# Patient Record
Sex: Male | Born: 1960 | Race: White | Hispanic: No | Marital: Married | Smoking: Former smoker
Health system: Southern US, Community
[De-identification: ages and names within clinical notes are randomized; demographics above are authoritative.]

## PROBLEM LIST (undated history)

## (undated) DIAGNOSIS — I10 Essential (primary) hypertension: Secondary | ICD-10-CM

## (undated) DIAGNOSIS — E785 Hyperlipidemia, unspecified: Secondary | ICD-10-CM

## (undated) DIAGNOSIS — N400 Enlarged prostate without lower urinary tract symptoms: Secondary | ICD-10-CM

## (undated) HISTORY — PX: APPENDECTOMY: SHX54

## (undated) HISTORY — PX: VASECTOMY: SHX75

## (undated) HISTORY — DX: Hyperlipidemia, unspecified: E78.5

## (undated) HISTORY — DX: Benign prostatic hyperplasia without lower urinary tract symptoms: N40.0

## (undated) HISTORY — PX: CHOLECYSTECTOMY: SHX55

## (undated) HISTORY — PX: CORONARY ANGIOPLASTY WITH STENT PLACEMENT: SHX49

---

## 1998-01-19 ENCOUNTER — Inpatient Hospital Stay (HOSPITAL_COMMUNITY): Admission: EM | Admit: 1998-01-19 | Discharge: 1998-01-21 | Payer: Self-pay | Admitting: Emergency Medicine

## 2000-02-26 ENCOUNTER — Inpatient Hospital Stay (HOSPITAL_COMMUNITY): Admission: EM | Admit: 2000-02-26 | Discharge: 2000-02-29 | Payer: Self-pay | Admitting: *Deleted

## 2002-08-10 ENCOUNTER — Encounter: Payer: Self-pay | Admitting: Family Medicine

## 2002-08-10 ENCOUNTER — Ambulatory Visit (HOSPITAL_COMMUNITY): Admission: RE | Admit: 2002-08-10 | Discharge: 2002-08-10 | Payer: Self-pay | Admitting: Family Medicine

## 2006-09-21 ENCOUNTER — Ambulatory Visit (HOSPITAL_COMMUNITY): Admission: RE | Admit: 2006-09-21 | Discharge: 2006-09-21 | Payer: Self-pay | Admitting: Family Medicine

## 2007-12-08 ENCOUNTER — Ambulatory Visit (HOSPITAL_COMMUNITY): Admission: RE | Admit: 2007-12-08 | Discharge: 2007-12-08 | Payer: Self-pay | Admitting: Family Medicine

## 2008-09-21 ENCOUNTER — Encounter: Payer: Self-pay | Admitting: Emergency Medicine

## 2008-09-21 ENCOUNTER — Inpatient Hospital Stay (HOSPITAL_COMMUNITY): Admission: EM | Admit: 2008-09-21 | Discharge: 2008-09-23 | Payer: Self-pay | Admitting: Cardiology

## 2008-09-26 ENCOUNTER — Emergency Department (HOSPITAL_COMMUNITY): Admission: EM | Admit: 2008-09-26 | Discharge: 2008-09-26 | Payer: Self-pay | Admitting: Emergency Medicine

## 2008-11-26 ENCOUNTER — Encounter: Admission: RE | Admit: 2008-11-26 | Discharge: 2008-11-26 | Payer: Self-pay | Admitting: Cardiovascular Disease

## 2008-12-04 ENCOUNTER — Ambulatory Visit (HOSPITAL_COMMUNITY): Admission: RE | Admit: 2008-12-04 | Discharge: 2008-12-05 | Payer: Self-pay | Admitting: Cardiovascular Disease

## 2010-10-12 LAB — CBC
HCT: 41.3 % (ref 39.0–52.0)
Hemoglobin: 14.2 g/dL (ref 13.0–17.0)
MCHC: 34.3 g/dL (ref 30.0–36.0)
MCV: 85.4 fL (ref 78.0–100.0)
Platelets: 171 10*3/uL (ref 150–400)
RBC: 4.84 MIL/uL (ref 4.22–5.81)
RDW: 13.4 % (ref 11.5–15.5)
WBC: 6.7 10*3/uL (ref 4.0–10.5)

## 2010-10-12 LAB — BASIC METABOLIC PANEL WITH GFR
BUN: 13 mg/dL (ref 6–23)
CO2: 30 meq/L (ref 19–32)
Calcium: 9.2 mg/dL (ref 8.4–10.5)
Chloride: 106 meq/L (ref 96–112)
Creatinine, Ser: 1.05 mg/dL (ref 0.4–1.5)
GFR calc non Af Amer: >60 mL/min
Glucose, Bld: 112 mg/dL — ABNORMAL HIGH (ref 70–99)
Potassium: 3.9 meq/L (ref 3.5–5.1)
Sodium: 142 meq/L (ref 135–145)

## 2010-10-12 LAB — LIPID PANEL
Cholesterol: 129 mg/dL (ref 0–200)
LDL Cholesterol: 88 mg/dL (ref 0–99)
Triglycerides: 68 mg/dL (ref <150)

## 2010-10-15 LAB — BASIC METABOLIC PANEL
BUN: 15 mg/dL (ref 6–23)
CO2: 27 mEq/L (ref 19–32)
CO2: 22 mEq/L (ref 19–32)
Chloride: 106 mEq/L (ref 96–112)
GFR calc non Af Amer: 53 mL/min — ABNORMAL LOW (ref >60)
Glucose, Bld: 102 mg/dL — ABNORMAL HIGH (ref 70–99)
Glucose, Bld: 110 mg/dL — ABNORMAL HIGH (ref 70–99)
Potassium: 4.2 mEq/L (ref 3.5–5.1)
Potassium: 3.1 mEq/L — ABNORMAL LOW (ref 3.5–5.1)
Sodium: 133 mEq/L — ABNORMAL LOW (ref 135–145)

## 2010-10-15 LAB — CBC
HCT: 42.8 % (ref 39.0–52.0)
HCT: 44.3 % (ref 39.0–52.0)
HCT: 47.4 % (ref 39.0–52.0)
HCT: 48.6 % (ref 39.0–52.0)
Hemoglobin: 15.6 g/dL (ref 13.0–17.0)
Hemoglobin: 16.5 g/dL (ref 13.0–17.0)
MCHC: 34.8 g/dL (ref 30.0–36.0)
MCHC: 34.8 g/dL (ref 30.0–36.0)
MCHC: 33.9 g/dL (ref 30.0–36.0)
MCV: 83.3 fL (ref 78.0–100.0)
MCV: 83.8 fL (ref 78.0–100.0)
MCV: 83.9 fL (ref 78.0–100.0)
MCV: 83.3 fL (ref 78.0–100.0)
Platelets: 162 10*3/uL (ref 150–400)
Platelets: 188 10*3/uL (ref 150–400)
Platelets: 197 10*3/uL (ref 150–400)
Platelets: 198 10*3/uL (ref 150–400)
Platelets: 200 K/uL (ref 150–400)
RBC: 5.83 MIL/uL — ABNORMAL HIGH (ref 4.22–5.81)
RDW: 12.4 % (ref 11.5–15.5)
RDW: 12.6 % (ref 11.5–15.5)
RDW: 12.8 % (ref 11.5–15.5)
WBC: 6.1 10*3/uL (ref 4.0–10.5)
WBC: 8 10*3/uL (ref 4.0–10.5)
WBC: 10.8 K/uL — ABNORMAL HIGH (ref 4.0–10.5)

## 2010-10-15 LAB — HEPATIC FUNCTION PANEL
ALT: 17 U/L (ref 0–53)
AST: 24 U/L (ref 0–37)
Alkaline Phosphatase: 46 U/L (ref 39–117)
Bilirubin, Direct: 0.2 mg/dL (ref 0.0–0.3)
Total Bilirubin: 1.2 mg/dL (ref 0.3–1.2)

## 2010-10-15 LAB — COMPREHENSIVE METABOLIC PANEL
Albumin: 3.4 g/dL — ABNORMAL LOW (ref 3.5–5.2)
Alkaline Phosphatase: 49 U/L (ref 39–117)
BUN: 15 mg/dL (ref 6–23)
BUN: 17 mg/dL (ref 6–23)
Calcium: 9.2 mg/dL (ref 8.4–10.5)
Chloride: 104 mEq/L (ref 96–112)
Chloride: 106 mEq/L (ref 96–112)
Creatinine, Ser: 1.11 mg/dL (ref 0.4–1.5)
Creatinine, Ser: 1.11 mg/dL (ref 0.4–1.5)
GFR calc non Af Amer: >60 mL/min (ref >60)
Glucose, Bld: 106 mg/dL — ABNORMAL HIGH (ref 70–99)
Potassium: 4.2 mEq/L (ref 3.5–5.1)
Total Bilirubin: 1.3 mg/dL — ABNORMAL HIGH (ref 0.3–1.2)
Total Bilirubin: 2.5 mg/dL — ABNORMAL HIGH (ref 0.3–1.2)

## 2010-10-15 LAB — CARDIAC PANEL(CRET KIN+CKTOT+MB+TROPI)
Relative Index: 1.8 (ref 0.0–2.5)
Troponin I: 0.01 ng/mL (ref 0.00–0.06)
Troponin I: <0.01 ng/mL (ref 0.00–0.06)

## 2010-10-15 LAB — PROTIME-INR
INR: 1 (ref 0.00–1.49)
Prothrombin Time: 13.4 s (ref 11.6–15.2)

## 2010-10-15 LAB — POCT CARDIAC MARKERS
CKMB, poc: 1.1 ng/mL (ref 1.0–8.0)
CKMB, poc: <1 ng/mL — ABNORMAL LOW (ref 1.0–8.0)
Myoglobin, poc: 101 ng/mL (ref 12–200)
Myoglobin, poc: 65.5 ng/mL (ref 12–200)
Troponin i, poc: <0.05 ng/mL (ref 0.00–0.09)
Troponin i, poc: <0.05 ng/mL (ref 0.00–0.09)

## 2010-10-15 LAB — HEPARIN LEVEL (UNFRACTIONATED): Heparin Unfractionated: 0.23 IU/mL — ABNORMAL LOW (ref 0.30–0.70)

## 2010-10-15 LAB — BASIC METABOLIC PANEL WITH GFR
BUN: 21 mg/dL (ref 6–23)
Calcium: 9.6 mg/dL (ref 8.4–10.5)
Chloride: 100 meq/L (ref 96–112)
Creatinine, Ser: 1.41 mg/dL (ref 0.4–1.5)
GFR calc Af Amer: >60 mL/min (ref >60)

## 2010-10-15 LAB — DIFFERENTIAL
Basophils Absolute: 0 K/uL (ref 0.0–0.1)
Basophils Relative: 1 % (ref 0–1)
Eosinophils Absolute: 0.2 10*3/uL (ref 0.0–0.7)
Eosinophils Relative: 2 % (ref 0–5)
Lymphocytes Relative: 20 % (ref 12–46)
Lymphocytes Relative: 22 % (ref 12–46)
Lymphs Abs: 1.6 10*3/uL (ref 0.7–4.0)
Lymphs Abs: 2.4 10*3/uL (ref 0.7–4.0)
Monocytes Absolute: 0.6 10*3/uL (ref 0.1–1.0)
Monocytes Relative: 6 % (ref 3–12)
Neutro Abs: 7.6 K/uL (ref 1.7–7.7)
Neutrophils Relative %: 71 % (ref 43–77)
Neutrophils Relative %: 71 % (ref 43–77)

## 2010-10-15 LAB — LIPID PANEL
Cholesterol: 148 mg/dL (ref 0–200)
HDL: 30 mg/dL — ABNORMAL LOW (ref >39)
Total CHOL/HDL Ratio: 4.9 RATIO
VLDL: 10 mg/dL (ref 0–40)

## 2010-10-15 LAB — AMYLASE: Amylase: 110 U/L (ref 27–131)

## 2010-10-15 LAB — HEPATITIS PANEL, ACUTE: Hep B C IgM: NEGATIVE

## 2010-10-15 LAB — LIPASE, BLOOD: Lipase: 33 U/L (ref 11–59)

## 2010-10-15 LAB — POCT I-STAT, CHEM 8
BUN: 15 mg/dL (ref 6–23)
Chloride: 103 mEq/L (ref 96–112)
Creatinine, Ser: 1 mg/dL (ref 0.4–1.5)
Potassium: 4.3 mEq/L (ref 3.5–5.1)
Sodium: 139 mEq/L (ref 135–145)

## 2010-10-15 LAB — MAGNESIUM: Magnesium: 1.9 mg/dL (ref 1.5–2.5)

## 2010-10-15 LAB — HEMOGLOBIN A1C
Hgb A1c MFr Bld: 5.5 % (ref 4.6–6.1)
Mean Plasma Glucose: 111 mg/dL

## 2010-11-17 NOTE — Consult Note (Signed)
[name], [name] NO.:  [id number]   MEDICAL RECORD NO.:  [id number]          PATIENT TYPE:  INP   LOCATION:  2036                         FACILITY:  [facility]   PHYSICIAN:  Graylin Shiver, M.D.   DATE OF BIRTH:  23-Oct-1960   DATE OF CONSULTATION:  09/22/2008  DATE OF DISCHARGE:                                 CONSULTATION   REASON FOR CONSULTATION:  The patient is a 50 year old male who  yesterday was working out in his yard and started to develop chest pain.  He went to an emergency facility where he was evaluated and subsequently  transferred to [facility] under the Cardiology Service.  Cardiology has  evaluated him and does not feel that the current pain is of cardiac  origin and requested a GI evaluation.  The patient points to the area of  pain as actually in the left to mid anterior lower chest and upper  abdomen area.  He tells me that this is where the pain was.  He states  that it radiated through to his back.  He is not having it at the  present time.  When he was admitted to the hospital, his initial hepatic  function panel at 4:25 p.m. showed that all of his liver enzymes were  normal, however, liver enzymes at 10:00 p.m. that evening showed that  his total bilirubin had increased to 2.5, AST 186, and ALT 100.  The  patient has a history of having had a cholecystectomy in 1999.  He does  not recall if he had gallstones or not.   The patient follows in the office periodically with Dr. Ewing Schlein.  He has  had intermittent epigastric discomfort felt secondary to reflux which  has improved periodically when he takes a proton pump inhibitor such as  Nexium or Prilosec.   The patient does have a history of coronary artery disease with a  coronary stent placed in the past.   OTHER MEDICAL PROBLEMS:  1. Hypertension.  2. Dyslipidemia.  3. GERD.   SOCIAL HISTORY:  Drinks alcohol very occasionally.  Used to smoke, but  has not done in 12 years.   ALLERGIES:  None known.   MEDICATIONS PRIOR TO ADMISSION:  Benicar/HCT, recent Levaquin, Flomax,  hyoscyamine, and aspirin.   REVIEW OF SYSTEMS:  The patient did have a little dyspnea with this and  some dizziness, otherwise negative.   PHYSICAL EXAMINATION:  GENERAL:  He is alert and oriented and in no  acute distress.  VITAL SIGNS:  Stable.  NECK:  Supple.  HEART:  Regular rhythm.  No murmurs.  LUNGS:  Clear.  ABDOMEN:  Soft and nontender.  No obvious hepatosplenomegaly.   IMPRESSION:  1. Probable noncardiac chest pain.  2. Upper abdominal pain.  3. History of cholecystectomy.  4. Elevated liver enzymes.  5. Rule out common bile duct stone.  6. Normal amylase and lipase with no clinical evidence of      pancreatitis.   PLAN:  I will obtain an MRCP to see if we might be dealing with a common  bile duct stone.           ______________________________  Graylin Shiver, M.D.     SFG/MEDQ  D:  09/22/2008  T:  09/22/2008  Job:  [id number]   cc:   Petra Kuba, M.D.  Edsel Petrin, D.O.

## 2010-11-17 NOTE — Discharge Summary (Signed)
[name], [name]                 ACCOUNT NO.:  [id number]   MEDICAL RECORD NO.:  [id number]          PATIENT TYPE:  OIB   LOCATION:  4703                         FACILITY:  [facility]   PHYSICIAN:  Richard A. Alanda Amass, M.D.DATE OF BIRTH:  10-14-1960   DATE OF ADMISSION:  12/04/2008  DATE OF DISCHARGE:  12/05/2008                               DISCHARGE SUMMARY   DISCHARGE DIAGNOSES:  1. Abnormal nuclear stress test after episode of chest and abdominal      discomfort.  2. Stable coronary arteries with in-stent restenosis of 40% to the      stent to the left anterior descending which was originally placed      in 1998.  3. Continual episodic bleeding of right groin cath site requiring      overnight observation.  4. Coronary artery disease with a history of stent to the left      anterior descending in 1998.   DISCHARGE CONDITION:  Improved.   PROCEDURES:  Combined left heart cath by Dr. Susa Griffins on December 04, 2008.   The patient presented to Short Stay for scheduled elective cardiac  catheterization which he underwent without complications.  They did an  Angio-Seal device at end of the procedure.  The patient did well, was  instructed on his coronary artery disease, and then was transferred to  Short Stay.  Due to the Angio-Seal, the patient was on bedrest for 2  hours.  When he got up, he started bleeding at site.  Pressure was held  and pressure dressing applied and he was back on bedrest for another 4  hours.  At the end of which time, he stood to walk and started bleeding  again.  At that point, we kept him overnight.  No further bleeding was  noted.  He stayed on bed rest until 10 p.m.  The pressure dressing was  removed at that point and the patient ambulated without any further  bleeding.  He has no complaints the next morning.  Hemoglobin did drop  from 15.2-14.2, hematocrit 41.3, discharge WBC 6.7, platelets 171, MCV  85.4.  Chemistry, sodium 142, potassium 3.9,  chloride 106, CO2 of 30,  BUN 13, creatinine 1.05, and glucose 112.   Cholesterol panel was done while he was fasting prior to the procedure,  total cholesterol 129, LDL 88, HDL 27, and triglycerides 68.  Please  note, the patient would benefit from Niaspan, but he has had problems  with statins in the past and he just now is tolerating his Crestor, so  we will wait until he sees Dr. Alanda Amass on December 19, 2008, for further  decision making on Niaspan to improve his HDL.   PHYSICAL EXAMINATION:  VITAL SIGNS AT DISCHARGE:  Blood pressure 103/67,  pulse 58, respirations 20, temp 97.8, oxygen saturation 96% on room air.  LUNGS:  Clear without rales, rhonchi, or wheezes.  HEART:  Heart tones S1 and S2, regular rate and rhythm, right groin cath  site.  SKIN:  Past hematoma somewhat slightly tense, but no bruit noted.  No  ecchymosis currently.  The patient was instructed ecchymosis most likely  would occur.  He was also instructed if any further bleeding or if the  site would become more swollen or painful to call our office and we  would evaluated it.  He is leaving on vacation on December 16, 2008, and he  was instructed if he had any questions to please call the [place]  office and we would see him prior to his vacation.   DISCHARGE MEDICATIONS:  1. Aspirin 81 mg daily.  2. Hyoscyamine 0.125 mg twice a day.  3. Flomax 0.4 mg every evening.  4. Flonase spray 2 sprays each nostril.  5. Benicar HCT 20/12.5 in the evening.  6. Protonix 40 mg daily.  7. Crestor 5 mg daily.   DISCHARGE INSTRUCTIONS:  1. Return to work as before and until after December 16, 2008, is the plan      and then he will be on vacation.  2. Low-sodium, heart-healthy diet.  3. Wash cath site with soap and water.  Call if any bleeding,      swelling, or drainage.  4. Increase activity slowly.  May shower, but no tub bath for 1 week      and no swimming for 1 week.  No lifting for 2 days.  No driving for      2 days.   5. Follow up with Dr. Alanda Amass in [place] on December 19, 2008, at      10:30 a.m.   The patient will call if he has problems.  Otherwise, he will be  discharged home.      Darcella Gasman. [place], N.P.      Richard A. Alanda Amass, M.D.  Electronically Signed    LRI/MEDQ  D:  12/05/2008  T:  12/05/2008  Job:  [id number]   cc:   Kirk Ruths, M.D.

## 2010-11-17 NOTE — H&P (Signed)
NAMEHAMISH, Ethan [name]                 ACCOUNT NO.:  [id number]   MEDICAL RECORD NO.:  [id number]          PATIENT TYPE:  OIB   LOCATION:  2899                         FACILITY:  [facility]   PHYSICIAN:  Richard A. Alanda Amass, M.D.DATE OF BIRTH:  11-Oct-1960   DATE OF ADMISSION:  12/04/2008  DATE OF DISCHARGE:                              HISTORY & PHYSICAL   CHIEF COMPLAINT:  Currently no complaints.   HISTORY OF PRESENT ILLNESS:  A 50 year old white divorced male who has a  history of coronary artery disease receiving a non-DES stent to the mid  LAD in 1998 by Dr. __________.  His last cardiac catheterization was in  2001 with dominant circ which was normal, small nondominant RCA and no  restenosis of the mid LAD stent.  He did have 30% narrowing of the  diagonal 1 and the ostium.  The large ramus was normal.  The patient had  been doing quite well until March 2010.  He came to [facility] after  transferring from [facility] secondary to upper abdominal and epigastric  discomfort with crampy pain.  Because of his history of coronary  disease, [facility] admitted him to [facility], but after evaluation and  cardiac enzymes which were negative, it was felt this was not cardiac.  EKG was stable, but he did have a presyncopal episode prior to that  admission.  He also had abnormal LFTs with SGOT of 186 and SGPT of 100,  and bilirubin was elevated to 2.5.  He had a CT of his head due to the  presyncope showing no CNS abnormality but inflammatory changes to his  sinus.  He had abdominal ultrasound with no acute findings.  He has a  history of past cholecystectomy and no biliary dilatation.  He did have  an MR study of his abdomen prior to discharge.  No intrahepatic biliary  dilatation with only mild distention of the extrahepatic common duct to  a degree in keeping with spectrum often seen in patient's after  cholecystectomy.  No evidence for ductal stones.  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  He also had a 2.4-  cm simple cyst in the left kidney.  The patient after discharge also saw  ENT doctor in [place] for his sinusitis.  He was found to have  sinus polyps.  His antibiotics were changed, and at this point, it has  completely cleared.  He has no nosebleeds and no sinus infections for  now.  He is using nasal spray, though.   ALLERGIES:  Intolerant to NIASPAN and SIMVASTATIN secondary to myalgias  and groggy feeling, and Niaspan made him feels nervous.   CURRENT MEDICATIONS:  1. Aspirin 81 mg daily.  2. Cialis 20 mg p.r.n.  3. Hyoscyamine 0.125 b.i.d.  4. Flomax 0.4 mg daily.  5. He uses betamethasone cream as needed.  6. Prilosec over-the-counter as needed.  7. Benicar HCT  20/12.5 daily.  8. Meclizine 25 mg p.r.n., but he has not used that in several months.  9. He does use Flonase 2 sprays each nostril daily.  10.Crestor was started at 5 mg daily.   Please note that Crestor was started when follow-up lipids were elevated  with LDL of 108, HDL of 30, and he has had 2 months on Crestor or a good  month and half, and he has not had any complaints.   FAMILY HISTORY:  Not significant to this admission.   SOCIAL HISTORY:  Divorced, 2 children, 2 grandchildren, remote tobacco  but he stopped in 1988.   Other history includes gastroesophageal reflux disease, hypertension,  hyperlipidemia and coronary disease as stated.  He also has erectile  dysfunction.   REVIEW OF SYSTEMS:  GENERAL:  No recent colds or fevers.  He actually  feels better.  He has left fatigue than he did.  He is able to mow the  grass without complications.  SKIN:  No rashes or ulcers.  HEENT: No  blurred vision, double vision.  No sinus infections for now.  HEART:  No  chest pain.  GI:  No complaints of GI discomfort  or bloating.  No  diarrhea, constipation or melena.  GU:  No hematuria or dysuria.  MUSCULOSKELETAL:  Denies any current complaints.  ENDOCRINE:  No  diabetes or thyroid disease and no symptoms of such.  NEUROLOGICAL:  No  lightheadedness, dizziness or syncope except for the one episode of  presyncope back in March that could have been vasovagal with GI issues.  CARDIOVASCULAR:  As stated.  PULMONARY:  As stated.   PHYSICAL EXAMINATION:  Blood pressure 119/69, pulse 96, respiratory 18,  temperature 97.4.  GENERAL:  Alert, oriented white male in no acute distress, awaiting  cardiac catheterization.  Affect pleasant.  HEENT:  Normocephalic.  Sclera clear.  NECK:  Supple.  No JVD, no bruits, no thyromegaly.  LUNGS:  Clear without rales, rhonchi or wheezes.  HEART:  Sounds S1-S2, regular rate and rhythm.  PMI is in the left fifth  intercostal space in the MCL.  No heave currently.  No S3.  No murmur.  ABDOMEN:  Soft, nontender, positive bowel sounds.  Do not palpate liver,  spleen or masses.  LOWER EXTREMITIES:  Without edema.  Pedal pulses 2+ bilaterally.  NEUROLOGICAL:  Alert and oriented x3.  Follows commands.  Moves all  extremities.   EKG on October 21, 2008, was read as within normal limits, nonspecific ST  changes compatible with early repolarization   Nuclear study which was done Nov 05, 2008:  Evidence of mild ischemia in  the basal anterior and mid anterior region, abnormal myocardial  perfusion imaging, EF of 61%.  EKG was negative for ischemia.  Chest x-  ray:  No active disease.  Hemoglobin 15.2, hematocrit 46, platelets 196,  WBC 8.6.  PT 13, INR 1, PTT 27.  Sodium 142, potassium 4.2, BUN 14,  creatinine 1.06 and glucose 75.   IMPRESSION:  1. Abnormal nuclear stress test.  2. History of coronary artery disease with stent to the mid LAD in      1998 by Dr. __________ .  3. Recent upper abdominal pain ruled out for MI but led to a nuclear      stress test which was  positive.  4. Recent sinusitis and sinus polyps without symptoms currently.   PLAN:  Cardiac catheterization as per Dr. Alanda Amass to evaluate cardiac  status with no ischemia.  Otherwise, the patient has no complaints.      Darcella Gasman. [place], N.P.      Richard A. Alanda Amass, M.D.  Electronically Signed    LRI/MEDQ  D:  12/04/2008  T:  12/04/2008  Job:  [id number]   cc:   Gerlene Burdock A. Alanda Amass, M.D.  Kirk Ruths, M.D.  Petra Kuba, M.D.

## 2010-11-17 NOTE — Cardiovascular Report (Signed)
NAMEEARLIE, SCHANK                 ACCOUNT NO.:  [id number]   MEDICAL RECORD NO.:  [id number]          PATIENT TYPE:  OIB   LOCATION:  2899                         FACILITY:  [facility]   PHYSICIAN:  Richard A. Alanda Amass, M.D.DATE OF BIRTH:  1961/03/15   DATE OF PROCEDURE:  12/04/2008  DATE OF DISCHARGE:                            CARDIAC CATHETERIZATION   PROCEDURES:  1. Retrograde central aortic catheterization.  2. Selective coronary angiography via Judkins technique.  3. LV angiogram in RAO and LAO projection.  4. Abdominal angiogram, hand injection.  5. Angio-Seal 6-French femoral closure device deployed successfully.   PROCEDURE IN DETAIL:  The patient was brought to second floor CP lab in  a postabsorptive state after 5 mg Valium p.o. premedication.  Right  groin was prepped, draped in the usual manner.  Xylocaine 1% was used  for local anesthesia, and the patient was given 2 mg of Versed for  sedation.  Informed consent was obtained prior to the procedure.  The  CRFA was entered with single anterior puncture using 18 thin-wall needle  and using modified Seldinger technique, a 6-French short sidearm sheath  was inserted without difficulty.  Diagnostic coronary angiography was  done with 6-French 4-cm tapered preformed coronary and pigtail Cordis  catheter.  Multiple orthogonal projections were obtained at different  magnifications.  LV angiogram was done in the RAO and LAO projection at  25 mL, 14 mL per second and 20 mL, 12 mL per second respectively.  Pullback pressure CA was performed and showed no gradient across the  aortic valve.  Catheter was brought down above the level of renal  arteries and hand injection was performed showing a normal single large  left renal artery and accessory dual normal right renal arteries.  Catheter was removed.  Side-arm sheath was flushed.  Hand injection of  the right common femoral showed good puncture into the right common  femoral on  oblique projection and 6-French Angio-Seal device was  successfully deployed.  The patient tolerated the procedure well, was  transferred to the holding area for postoperative care with anticipation  of discharge later today.   PRESSURES:  LV:  110/0; LVEDP 14 mmHg.   CA:  110/60 mmHg.   There was no gradient across the aortic valve on catheter pullback.   LV angiogram in the RAO and LAO projection showed a normally contracting  left ventricle with no segmental wall motion abnormalities and EF of  greater than 55%.  There was no mitral regurgitation seen.   Live fluoroscopy did not reveal any coronary, intracardiac, or valvular  calcification.  The previously placed proximal-mid LAD stent from 1998  was easily visible (non DES NIR stent).   The main left coronary was normal.   The left anterior descending was a large vessel that coursed to the apex  and undersurface of the heart.  There was a moderate-sized DX1 before  SP1 before the previously placed stent.  It bifurcated and a superior  bifurcation branch had approximately 50% narrowing with good flow.  This  was a thin vessel but moderate length.  At  the junction of the proximal  and mid third of the LAD was previously placed stent from 1998, which  was easily visible fluoroscopically.  There was some very minimal  haziness in the proximal third of the stent with approximately 40%  narrowing with good TIMI III flow.  No evidence of thrombus or  obstruction and good flow of the LAD to the undersurface and apex of the  heart.  There was about 30% narrowing proximal to the stent that was  smooth.  Two septal perforators came off from the midportion of the  stent that were normal.  There was a small bifurcating DX2 that arose  from beyond the stented area.   The remainder of the LAD was smooth with no significant narrowing or  stenosis or flow reduction.   There was an optional diagonal vessel that trifurcated, was moderately   large and had no significant stenosis.   There was a small OM1 and a dominant smooth normal-appearing circumflex  that gave off a PDA and PLA distally and several distal marginal  branches that were normal.   The right coronary was a nondominant vessel giving off the large RV  branch, atrial branch, and was normal throughout its course.   DISCUSSION:  Mr. Rahming is a very pleasant 50 year old divorced white  father of two with two GC who quit smoking an 60.  He is a prior  patient of Dr. Domingo Sep and came under my care in April 2010.  He has a  history of hypertension, hyperlipidemia, and has had statin intolerance  in the past with diffuse myalgias.  He had non DES NIR stenting by Dr.  Meryl Crutch of the proximal-mid LAD in 1998 for symptomatic CAD.  Recatheterization in 2001 showed no restenosis in the mid LAD stent and  otherwise essentially normal coronaries.  There was no ischemia on  followup nuclear test in May 2008.  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  In view of this, a repeat catheterization was  recommended.   The patient has also had a prior history of laparoscopic cholecystectomy  and had a hospitalization in March 2010, with abdominal discomfort,  transient elevated LFTs, and may have passed a common duct stone, was  seen by Dr. Evette Cristal at that time.  He has not had any recurrent GI  symptoms and has not had any angina.   He has also been on Crestor since our last office visit and he has  tolerated this well with no significant STATIN intolerance.  Followup  lipids are pending.   Catheterization reveals some mild haziness in the proximal portion of  the LAD stent, but excellent flow with no flow reduction or thrombus and  normal dominant circumflex and nondominant moderate-sized RCA.  I would  recommend  continued medical therapy including statin therapy, which he  is tolerating well at present.  He will have GI followup with Dr. Ewing Schlein.   CATHETERIZATION DIAGNOSES:  1. Arteriosclerotic heart disease - status post non drug-eluting stent      NIR stent in 1998.  2. A widely patent NIR stent on followup catheterization in 2001, Dr.      Jenne Campus.  3. Patent left anterior descending coronary artery stent on this study      with 40% or less narrowing proximal third.  4. 50%  superior bifurcation branch DX1 narrowing, normal dominant      circumflex, OD and nondominant right coronary artery with normal      left ventricular function.  5. Presumed false negative Cardiolite, May 2010.  6. Hyperlipidemia, statin intolerance stable on Crestor now.  7. Systemic hypertension, normal renal arteries as outlined above.  8. Remote laparoscopic cholecystectomy in 1999.  9. Hospital admission in March 2010 for possible passing common duct      stone.  No recurrence of symptoms.      Richard A. Alanda Amass, M.D.  Electronically Signed     RAW/MEDQ  D:  12/04/2008  T:  12/04/2008  Job:  [id number]   cc:   Kirk Ruths, M.D.  Petra Kuba, M.D.

## 2010-11-17 NOTE — Discharge Summary (Signed)
[name], [name]                 ACCOUNT NO.:  [id number]   MEDICAL RECORD NO.:  [id number]          PATIENT TYPE:  INP   LOCATION:  2036                         FACILITY:  [facility]   PHYSICIAN:  Richard A. Alanda Amass, M.D.DATE OF BIRTH:  January 15, 1961   DATE OF ADMISSION:  09/21/2008  DATE OF DISCHARGE:  09/23/2008                               DISCHARGE SUMMARY   DISCHARGE DIAGNOSES:  1. Epigastric pain, we suspect this might have been from a passed      common bile duct stone, although he had a negative magnetic      resonance cholangiopancreatography this admission.  He did have      transient liver function elevation and nausea and vomiting and      abdominal pain.  2. Known coronary artery disease with previous left anterior      descending stenting in 1998 with catheterization in 2001 showing a      patent stent site.  3. Treated hypertension.  4. Sinusitis, on Levaquin.  5. Gastroesophageal reflux, followed by Dr. Ewing Schlein.   HOSPITAL COURSE:  [name] is a pleasant 51 year old male, followed by  Dr. Phillips Odor, and seen in the past by Dr. Alanda Amass with known coronary  artery disease.  He had an LAD stent in 1998.  He was cath'd in 2001,  and he had a patent stent site and a mild residual first diagonal  narrowing of 30%.  He has treated hypertension.  He has had some  dyslipidemia in the past, but has been intolerant to STATINS because of  myalgias.  The patient was transferred from [facility] where he  initially presented with abdominal pain, nausea, and reportedly some  chest pain, although the patient tells me it was all epigastric  discomfort.  He admitted to nausea after eating.  He was admitted to  telemetry.  He was seen in consult by the GI Service.  His initial LFTs  were elevated with a SGOT of 186 and an SGPT of 100.  His troponins were  negative.  Bilirubin was elevated to 2.5.  The patient was put on clear  liquids.  He was seen in consult by the GI Service.   He did have an MRCP  done on August 25, 2008, which showed no intrahepatic biliary  dilatation with only mild distention of the extrahepatic common duct.  This was in keeping with the spectrum often seen in patients after  cholecystectomy.  Ultrasound of his abdomen revealed no acute findings  and that the patient was status post cholecystectomy.  Symptomatically,  the patient improved over 24 hours with clear liquids.  He did say on  the day of admission, he had a sharp pain that went all the way across  his abdomen.  He has had no further pain and has tolerated the diet  well.  He was put on nitrate on admission and the patient complained of  headache and so this was discontinued.  We feel he can be discharged on  September 23, 2008.  He knows to follow up with Dr. Ewing Schlein.  We will  have him  see Dr. Alanda Amass back in the office in [place].  He can return to  work on Wednesday, September 25, 2008, if he is feeling okay.   DISCHARGE MEDICATIONS:  1. Hyoscyamine 0.125 mg b.i.d.  2. Flomax 0.4 mg at bedtime.  3. Levaquin 500 mg a day.  4. Benicar 20 mg a day.  5. Protonix 40 mg a day.  6. Baby aspirin daily.   LABORATORIES:  White count 6.1, hemoglobin 14.9, hematocrit 42.8, and  platelets 162.  At discharge, his sodium is 141, potassium 4.0, BUN 17,  and creatinine 1.1.  His total bilirubin is down to 1.3, SGOT is 33, and  SGPT is 59.  Amylase and lipase were within normal limits.  CK-MB and  troponins were negative.  Total cholesterol is 148 with an LDL of 108  and HDL of 49.  TSH is 0.56.  Hepatitis B and C surface antigen are  negative.  Chest x-ray shows no acute pulmonary process.  EKG shows  sinus rhythm without acute changes.   DISPOSITION:  The patient is discharged in stable condition and will  follow up with Dr. Ewing Schlein and Dr. Alanda Amass.  At some point, we may want  to consider rechallenging him with a low-dose statin, possibly 2.5 of  Crestor 3 times a week.  We will defer  this to follow up with Dr.  Alanda Amass.       Abelino Derrick, P.A.      Richard A. Alanda Amass, M.D.  Electronically Signed    LKK/MEDQ  D:  09/23/2008  T:  09/24/2008  Job:  [id number]   cc:   Gerlene Burdock A. Alanda Amass, M.D.  Corrie Mckusick, M.D.  Petra Kuba, M.D.

## 2010-11-20 NOTE — Discharge Summary (Signed)
[place]  Patient:    [name], [name]                        MRN: [id number] Adm. Date:  [id number] Disc. Date: [id number] Attending:  Ruta Hinds Dictator:   [facility] CC:         Dr. _________, primary care   Discharge Summary  ADMISSION DIAGNOSES: 1. Unstable angina/chest pain. 2. History of coronary artery disease with percutaneous transluminal coronary    angioplasty to the left anterior descending by Wyvonna Plum.,    M.D. 3. Unknown lipid status. 4. Premature family history of coronary artery disease. 5. Upper quadrant pain.  DISCHARGE DIAGNOSES: 1. Unstable angina/chest pain. 2. History of coronary artery disease with percutaneous transluminal coronary    angioplasty to the left anterior descending by Wyvonna Plum.,    M.D. 3. Unknown lipid status. 4. Premature family history of coronary artery disease. 5. Upper quadrant pain. 6. Status post cardiac catheterization February 29, 2000, by Lenise Herald,    M.D., revealing nonobstructive coronary artery disease.  HISTORY OF PRESENT ILLNESS:  [name] is a 50 year old white divorced male patient with a history of CAD, status post stenting by Dr. Daisy Floro in November of 1998 and recatherization in December of 1998.  He then underwent cholecystectomy January of 1999.  He was transferred to [facility] from [facility] on February 26, 2000, with complaints of chest pain.  He states that he had mostly right upper quadrant discomfort, which was constant during the day and had episodes of tightness in the left side of the chest and neck.  There was no shortness of breath.  Positive diaphoresis, positive weakness, positive presyncope, positive nausea.  He rates it a 7/10 at the worst.  It had waxed and waned and lasted on and off from 11:30 a.m. to 1:30 p.m.  He was given nitroglycerin with some improvement.  He had  abdominal CT which was negative.  Demerol and Phenergan IV eased off the right upper quadrant pain.  On examination here, his blood pressure 136/83.  At that time he was planned to be treated as possible angina.  We will plan to recatheterize him on Monday or Tuesday.  It was felt that he would probably also require a GI consultation as part of the evaluation.  HOSPITAL COURSE:  Over the weekend, over February 27, 2000, and February 28, 2000, [name] remained stable and cardiac enzymes were negative.  He was planned for cardiac catheterization Monday.  On February 29, 2000, [name] underwent cardiac catheterization by Dr. Lenise Herald.  Please see his dictated catheterization report for further detail. Left ventriculogram shows ejection fraction 50%.  He has a codominant system. There is a 30% stenosis off the ostial first diagonal.  Otherwise he has no other significant CAD.  At that time Dr. Jenne Campus planned for bedrest and then discharge home following bedrest if his groin is stable.  He will need an outpatient GI follow-up consult.  On the afternoon of February 29, 2000, [name] is hemodynamically stable with blood pressure in the 120s/60s to 80s.  His right groin is stable at this time with a compression bandage in place and no bleed or hematoma.  If he remains stable at the end of his bedrest and is able to ambulate with no difficulty he will be discharged home at that time.  Post  catheterization instructions have been reviewed. The patient is to follow up with Dr. Alanda Amass in [place], [place]. The office will call him with that appointment.  As well, he will follow up with a GI appointment as an outpatient.  HOSPITAL CONSULTS:  None.  HOSPITAL PROCEDURES:  Cardiac catheterization on February 29, 2000, by Dr. Lenise Herald revealing nonobstructive CAD, normal left ventricular function with EF 50%.  HOSPITAL LABORATORY DATA:  Cardiac enzymes negative with CKs of 74,  61, and 109, MBs 0.6, 0.7, and less than 0.3.  Troponin is 0.04 and 0.03.  Lipid profile showed cholesterol 148, triglyceride 103, HDL 31, LDL 96.  Metabolic profile normal with sodium 140, potassium 4.2, glucose 102, BUN 11, creatinine 0.8.  CBC reveals wbc 7.6, hemoglobin 15.7, hematocrit 46.4, platelets 201.  EKG reveals normal sinus rhythm, 66 beats per minute with no ST or T-wave change.  DISCHARGE MEDICATIONS:  He is to continue his Flagyl as previously instructed, otherwise no medication changes until he follows up with GI.  ACTIVITY:  He is to perform no strenuous activity, lifting greater than five pounds, driving or sexual activity for three days.  DISCHARGE INSTRUCTIONS:  He is to call our office at [phone] if there is any bleeding or increased size or pain of the groin.  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 DD:  02/29/00 TD:  03/01/00 Job: [id number]

## 2010-11-20 NOTE — Cardiovascular Report (Signed)
[place]  Patient:    [name], [name]                        MRN: [id number] Proc. Date: 02/29/00 Adm. Date:  [id number] Attending:  Ruta Hinds CC:         Richard A. Alanda Amass, M.D.                        Cardiac Catheterization  PROCEDURE: 1. Left heart catheterization. 2. Coronary angiography. 3. Left ventriculogram.  COMPLICATIONS:  None.  INDICATIONS:  [name] is a 50 year old white male patient of Dr. Kandy Garrison and Dr. Susa Griffins with a history of PTCA and stenting of his LAD in 1998 by Dr. Daisy Floro.  The patient was admitted on February 26, 2000, with recurrent angina.  He was subsequently ruled out for myocardial infarction.  He is now referred for repeat catheterization.  DESCRIPTION OF PROCEDURE:  After giving informed written consent, the patient was brought to the cardiac catheterization lab where his right and left groins were shaved, prepped and draped in the usual sterile fashion.  ECG monitoring was established.  Using modified Seldinger technique, a #6 French arterial sheath was inserted in the right femoral artery.  A 6 French diagnostic catheter was then used to perform diagnostic angiography.  This revealed a large left main with no significant disease.  The LAD is a large vessel which coursed the apex and gave rise to one diagonal branch.  The LAD was noted to have a stent in its midportion just after the takeoff of the first diagonal with no evidence of instent restenosis.  The remainder of the LAD has no significant disease.  The first diagonal was a large vessel with a 30% ostial lesion.  The left coronary artery also gave rise to a large ramus intermedius which bifurcates distally and has no significant disease.  The left circumflex is a large vessel which is noted to be codominant and gives rise to three obtuse marginal branches as well as a PDA and posterolateral branch.  There is no significant  disease in the AV groove, marginal branches, PDA, or posterolateral branch.  The right coronary artery is a small vessel which is codominant and gives rise to PDA.  There is no significant disease in the RCA or PDA.  HEMODYNAMICS:  Systemic arterial pressure 118/76, LV systemic pressure 118/10, LVDP of 12.  CONCLUSION: 1. No evidence of significant coronary artery disease. 2. Normal left ventricular systolic function. DD:  02/29/00 TD:  02/29/00 Job: [id number]

## 2011-09-17 ENCOUNTER — Other Ambulatory Visit (HOSPITAL_COMMUNITY): Payer: Self-pay | Admitting: Physician Assistant

## 2011-09-17 DIAGNOSIS — K469 Unspecified abdominal hernia without obstruction or gangrene: Secondary | ICD-10-CM

## 2011-09-17 DIAGNOSIS — N419 Inflammatory disease of prostate, unspecified: Secondary | ICD-10-CM

## 2011-09-21 ENCOUNTER — Other Ambulatory Visit (HOSPITAL_COMMUNITY): Payer: Self-pay

## 2011-09-21 ENCOUNTER — Ambulatory Visit (HOSPITAL_COMMUNITY)
Admission: RE | Admit: 2011-09-21 | Discharge: 2011-09-21 | Disposition: A | Discharge status: Home / Self Care | Payer: Federal, State, Local not specified - PPO | Source: Ambulatory Visit | Attending: Physician Assistant | Admitting: Physician Assistant

## 2011-09-21 ENCOUNTER — Encounter (HOSPITAL_COMMUNITY): Payer: Self-pay

## 2011-09-21 DIAGNOSIS — N419 Inflammatory disease of prostate, unspecified: Secondary | ICD-10-CM | POA: Insufficient documentation

## 2011-09-21 DIAGNOSIS — R9389 Abnormal findings on diagnostic imaging of other specified body structures: Secondary | ICD-10-CM | POA: Insufficient documentation

## 2011-09-21 DIAGNOSIS — Q619 Cystic kidney disease, unspecified: Secondary | ICD-10-CM | POA: Insufficient documentation

## 2011-09-21 DIAGNOSIS — K469 Unspecified abdominal hernia without obstruction or gangrene: Secondary | ICD-10-CM

## 2011-09-21 DIAGNOSIS — N400 Enlarged prostate without lower urinary tract symptoms: Secondary | ICD-10-CM | POA: Insufficient documentation

## 2011-09-21 HISTORY — DX: Essential (primary) hypertension: I10

## 2011-09-22 ENCOUNTER — Other Ambulatory Visit (HOSPITAL_COMMUNITY): Payer: Self-pay | Admitting: Physician Assistant

## 2011-09-22 DIAGNOSIS — N289 Disorder of kidney and ureter, unspecified: Secondary | ICD-10-CM

## 2011-09-24 ENCOUNTER — Ambulatory Visit (HOSPITAL_COMMUNITY)
Admission: RE | Admit: 2011-09-24 | Discharge: 2011-09-24 | Disposition: A | Discharge status: Home / Self Care | Payer: Federal, State, Local not specified - PPO | Source: Ambulatory Visit | Attending: Physician Assistant | Admitting: Physician Assistant

## 2011-09-24 ENCOUNTER — Other Ambulatory Visit (HOSPITAL_COMMUNITY): Payer: Self-pay | Admitting: Physician Assistant

## 2011-09-24 DIAGNOSIS — Q619 Cystic kidney disease, unspecified: Secondary | ICD-10-CM | POA: Insufficient documentation

## 2011-09-24 DIAGNOSIS — N289 Disorder of kidney and ureter, unspecified: Secondary | ICD-10-CM

## 2011-09-24 DIAGNOSIS — K7689 Other specified diseases of liver: Secondary | ICD-10-CM | POA: Insufficient documentation

## 2011-09-24 DIAGNOSIS — R109 Unspecified abdominal pain: Secondary | ICD-10-CM | POA: Insufficient documentation

## 2011-09-24 LAB — POCT I-STAT, CHEM 8
BUN: 14 mg/dL (ref 6–23)
Calcium, Ion: 1.07 mmol/L — ABNORMAL LOW (ref 1.12–1.32)
Chloride: 114 mEq/L — ABNORMAL HIGH (ref 96–112)
Glucose, Bld: 122 mg/dL — ABNORMAL HIGH (ref 70–99)

## 2011-09-24 MED ORDER — GADOBENATE DIMEGLUMINE 529 MG/ML IV SOLN
20.0000 mL | Freq: Once | INTRAVENOUS | Status: AC | PRN
  Administered 2011-09-24: 20 mL via INTRAVENOUS

## 2011-10-22 ENCOUNTER — Ambulatory Visit (INDEPENDENT_AMBULATORY_CARE_PROVIDER_SITE_OTHER): Payer: Federal, State, Local not specified - PPO | Admitting: Urology

## 2011-10-22 DIAGNOSIS — N529 Male erectile dysfunction, unspecified: Secondary | ICD-10-CM

## 2011-10-22 DIAGNOSIS — N401 Enlarged prostate with lower urinary tract symptoms: Secondary | ICD-10-CM

## 2011-10-22 DIAGNOSIS — R1032 Left lower quadrant pain: Secondary | ICD-10-CM

## 2011-10-22 DIAGNOSIS — R3129 Other microscopic hematuria: Secondary | ICD-10-CM

## 2011-12-13 ENCOUNTER — Other Ambulatory Visit (HOSPITAL_COMMUNITY): Payer: Self-pay | Admitting: Family Medicine

## 2011-12-13 DIAGNOSIS — M549 Dorsalgia, unspecified: Secondary | ICD-10-CM

## 2011-12-15 ENCOUNTER — Ambulatory Visit (HOSPITAL_COMMUNITY)
Admission: RE | Admit: 2011-12-15 | Discharge: 2011-12-15 | Disposition: A | Discharge status: Home / Self Care | Payer: Federal, State, Local not specified - PPO | Source: Ambulatory Visit | Attending: Family Medicine | Admitting: Family Medicine

## 2011-12-15 DIAGNOSIS — M79609 Pain in unspecified limb: Secondary | ICD-10-CM | POA: Insufficient documentation

## 2011-12-15 DIAGNOSIS — M545 Low back pain, unspecified: Secondary | ICD-10-CM | POA: Insufficient documentation

## 2011-12-15 DIAGNOSIS — M5126 Other intervertebral disc displacement, lumbar region: Secondary | ICD-10-CM | POA: Insufficient documentation

## 2011-12-15 DIAGNOSIS — M549 Dorsalgia, unspecified: Secondary | ICD-10-CM

## 2011-12-24 ENCOUNTER — Ambulatory Visit (INDEPENDENT_AMBULATORY_CARE_PROVIDER_SITE_OTHER): Payer: Federal, State, Local not specified - PPO | Admitting: Urology

## 2011-12-24 DIAGNOSIS — R3129 Other microscopic hematuria: Secondary | ICD-10-CM

## 2011-12-24 DIAGNOSIS — R1032 Left lower quadrant pain: Secondary | ICD-10-CM

## 2012-03-03 ENCOUNTER — Ambulatory Visit (INDEPENDENT_AMBULATORY_CARE_PROVIDER_SITE_OTHER): Payer: Federal, State, Local not specified - PPO | Admitting: Urology

## 2012-03-03 DIAGNOSIS — N529 Male erectile dysfunction, unspecified: Secondary | ICD-10-CM

## 2012-03-03 DIAGNOSIS — N401 Enlarged prostate with lower urinary tract symptoms: Secondary | ICD-10-CM

## 2012-03-03 DIAGNOSIS — R3129 Other microscopic hematuria: Secondary | ICD-10-CM

## 2012-09-08 ENCOUNTER — Other Ambulatory Visit: Payer: Self-pay

## 2012-09-08 ENCOUNTER — Emergency Department (HOSPITAL_COMMUNITY): Payer: Federal, State, Local not specified - PPO

## 2012-09-08 ENCOUNTER — Encounter (HOSPITAL_COMMUNITY): Payer: Self-pay | Admitting: Emergency Medicine

## 2012-09-08 ENCOUNTER — Observation Stay (HOSPITAL_COMMUNITY)
Admission: EM | Admit: 2012-09-08 | Discharge: 2012-09-09 | Disposition: A | Discharge status: Home / Self Care | Payer: Federal, State, Local not specified - PPO | Attending: Internal Medicine | Admitting: Internal Medicine

## 2012-09-08 DIAGNOSIS — R0789 Other chest pain: Secondary | ICD-10-CM

## 2012-09-08 DIAGNOSIS — I2510 Atherosclerotic heart disease of native coronary artery without angina pectoris: Secondary | ICD-10-CM | POA: Insufficient documentation

## 2012-09-08 DIAGNOSIS — R079 Chest pain, unspecified: Principal | ICD-10-CM | POA: Diagnosis present

## 2012-09-08 DIAGNOSIS — I10 Essential (primary) hypertension: Secondary | ICD-10-CM | POA: Diagnosis present

## 2012-09-08 LAB — BASIC METABOLIC PANEL
BUN: 10 mg/dL (ref 6–23)
CO2: 23 mEq/L (ref 19–32)
Calcium: 9.9 mg/dL (ref 8.4–10.5)
Creatinine, Ser: 0.95 mg/dL (ref 0.50–1.35)

## 2012-09-08 LAB — CBC WITH DIFFERENTIAL/PLATELET
Basophils Absolute: 0 10*3/uL (ref 0.0–0.1)
Eosinophils Relative: 3 % (ref 0–5)
HCT: 44.7 % (ref 39.0–52.0)
Hemoglobin: 15.7 g/dL (ref 13.0–17.0)
Lymphocytes Relative: 18 % (ref 12–46)
MCHC: 35.1 g/dL (ref 30.0–36.0)
MCV: 81.6 fL (ref 78.0–100.0)
Monocytes Absolute: 0.5 10*3/uL (ref 0.1–1.0)
Monocytes Relative: 6 % (ref 3–12)
RDW: 12.7 % (ref 11.5–15.5)
WBC: 7.9 10*3/uL (ref 4.0–10.5)

## 2012-09-08 LAB — TROPONIN I: Troponin I: <0.3 ng/mL (ref <0.30)

## 2012-09-08 MED ORDER — FLUTICASONE PROPIONATE 50 MCG/ACT NA SUSP
2.0000 | Freq: Every day | NASAL | Status: DC
  Filled 2012-09-08: qty 16

## 2012-09-08 MED ORDER — ONDANSETRON HCL 4 MG PO TABS: 4.0000 mg | ORAL_TABLET | Freq: Four times a day (QID) | ORAL | Status: DC | PRN

## 2012-09-08 MED ORDER — NITROGLYCERIN 0.4 MG SL SUBL
0.4000 mg | SUBLINGUAL_TABLET | SUBLINGUAL | Status: DC | PRN
  Administered 2012-09-08: 0.4 mg via SUBLINGUAL
  Filled 2012-09-08: qty 25

## 2012-09-08 MED ORDER — PANTOPRAZOLE SODIUM 40 MG IV SOLR
40.0000 mg | Freq: Once | INTRAVENOUS | Status: AC
  Administered 2012-09-08: 40 mg via INTRAVENOUS
  Filled 2012-09-08: qty 40

## 2012-09-08 MED ORDER — MORPHINE SULFATE 2 MG/ML IJ SOLN
2.0000 mg | Freq: Once | INTRAMUSCULAR | Status: AC
  Administered 2012-09-08: 2 mg via INTRAVENOUS
  Filled 2012-09-08: qty 1

## 2012-09-08 MED ORDER — ONDANSETRON HCL 4 MG/2ML IJ SOLN: 4.0000 mg | Freq: Four times a day (QID) | INTRAMUSCULAR | Status: DC | PRN

## 2012-09-08 MED ORDER — TAMSULOSIN HCL 0.4 MG PO CAPS
0.4000 mg | ORAL_CAPSULE | Freq: Every day | ORAL | Status: DC
  Administered 2012-09-08 – 2012-09-09 (×2): 0.4 mg via ORAL
  Filled 2012-09-08 (×2): qty 1

## 2012-09-08 MED ORDER — ONDANSETRON HCL 4 MG/2ML IJ SOLN
4.0000 mg | Freq: Once | INTRAMUSCULAR | Status: AC
  Administered 2012-09-08: 4 mg via INTRAVENOUS
  Filled 2012-09-08: qty 2

## 2012-09-08 MED ORDER — OMEPRAZOLE 20 MG PO CPDR
20.0000 mg | DELAYED_RELEASE_CAPSULE | Freq: Every day | ORAL | Status: DC
  Administered 2012-09-08 – 2012-09-09 (×2): 20 mg via ORAL
  Filled 2012-09-08 (×4): qty 1

## 2012-09-08 MED ORDER — HEPARIN SODIUM (PORCINE) 5000 UNIT/ML IJ SOLN
5000.0000 [IU] | Freq: Three times a day (TID) | INTRAMUSCULAR | Status: DC
  Administered 2012-09-08 – 2012-09-09 (×2): 5000 [IU] via SUBCUTANEOUS
  Filled 2012-09-08 (×2): qty 1

## 2012-09-08 MED ORDER — OMEPRAZOLE MAGNESIUM 20 MG PO TBEC: 20.0000 mg | DELAYED_RELEASE_TABLET | Freq: Every day | ORAL | Status: DC

## 2012-09-08 MED ORDER — IRBESARTAN 150 MG PO TABS
150.0000 mg | ORAL_TABLET | Freq: Every day | ORAL | Status: DC
  Administered 2012-09-08 – 2012-09-09 (×2): 150 mg via ORAL
  Filled 2012-09-08 (×2): qty 1

## 2012-09-08 MED ORDER — MORPHINE SULFATE 2 MG/ML IJ SOLN: 2.0000 mg | INTRAMUSCULAR | Status: DC | PRN

## 2012-09-08 MED ORDER — SODIUM CHLORIDE 0.9 % IJ SOLN
3.0000 mL | Freq: Two times a day (BID) | INTRAMUSCULAR | Status: DC
  Administered 2012-09-08: 3 mL via INTRAVENOUS

## 2012-09-08 MED ORDER — NITROGLYCERIN 2 % TD OINT
1.0000 [in_us] | TOPICAL_OINTMENT | Freq: Once | TRANSDERMAL | Status: AC
  Administered 2012-09-08: 1 [in_us] via TOPICAL
  Filled 2012-09-08: qty 1

## 2012-09-08 MED ORDER — ASPIRIN EC 81 MG PO TBEC
81.0000 mg | DELAYED_RELEASE_TABLET | Freq: Every day | ORAL | Status: DC
  Administered 2012-09-09: 81 mg via ORAL
  Filled 2012-09-08: qty 1

## 2012-09-08 NOTE — ED Notes (Signed)
Pt eating lunch. Wife at bedside. Pt waiting for inpatient bed assignment. States his pain is a 2/10. Left chest area

## 2012-09-08 NOTE — ED Provider Notes (Signed)
Medical screening examination/treatment/procedure(s) were conducted as a shared visit with non-physician practitioner(s) and myself.  I personally evaluated the patient during the encounter.  High risk for coronary artery disease and good history. Admit.  Donnetta Hutching, MD 09/08/12 [phone]

## 2012-09-08 NOTE — ED Notes (Signed)
Pt c/o left sided chest pain and pain under left rib while shoveling snow. Pt states pain was a burning sensation and radiated through body. Pt states he has a hiatal hernia and this pain feels similar.

## 2012-09-08 NOTE — H&P (Signed)
[facility] History and Physical  [name]:[id number] DOB: [birth year] DOA: 09/08/2012  Referring physician: Dr. Adriana Simas, ER physician. PCP: Kirk Ruths, MD    Chief Complaint: Left-sided chest pain.  HPI: [name] is a 52 y.o. male complains of left-sided chest pain which started 8 AM this morning after he had been shoveling some snow. For the last 2 weeks or so years been describing left-sided upper abdominal pain, possibly left-sided lower chest pain which she relates to his hiatal hernia and Prilosec apparently has helped this symptom. This morning, after shoveling snow, he started having left-sided chest pain. This was associated with central burning chest pain radiating to both his arms, this lasted only seconds, definitely less than 1 minute. It was associated with  profuse sweating. He does have a history of coronary artery disease, having had cardiac catheterization and stenting procedure in the 1990s. He is in next smoker, having quit 16 years ago. He does have a strong family history of early coronary artery disease. For these reasons, he is now being admitted for observation.   Review of Systems: Apart from history of present illness, other systems negative.  Past Medical History  Diagnosis Date  . Hypertension    Past Surgical History  Procedure Laterality Date  . Coronary angioplasty with stent placement    . Cholecystectomy    . Appendectomy    . Vasectomy     Social History:  He is single, has a girlfriend. Does not smoke. Virtually no alcohol. He works as a Museum/gallery curator.  Allergies  Allergen Reactions  . Statins Other (See Comments)    Muscles hurt.  . Levofloxacin Palpitations    No family history on file. his father had bypass surgery in his early 69s. His sibling  also have coronary artery disease.  Prior to Admission medications   Medication Sig Start Date End Date Taking? Authorizing Provider  aspirin 81 MG tablet Take 81 mg by mouth  daily.   Yes Historical Provider, MD  fluticasone (FLONASE) 50 MCG/ACT nasal spray Place 2 sprays into the nose daily.   Yes Historical Provider, MD  olmesartan (BENICAR) 20 MG tablet Take 20 mg by mouth daily.   Yes Historical Provider, MD  omeprazole (PRILOSEC OTC) 20 MG tablet Take 20 mg by mouth daily.   Yes Historical Provider, MD  tamsulosin (FLOMAX) 0.4 MG CAPS Take 0.4 mg by mouth daily.   Yes Historical Provider, MD   Physical Exam: Filed Vitals:   09/08/12 0906 09/08/12 1020 09/08/12 1118 09/08/12 1219  BP: 123/79 125/73 105/69 98/58  Pulse: 69 63 68 66  Temp: 98.3 F (36.8 C)     TempSrc: Oral     Resp:  20 20   SpO2: 99% 97% 98% 96%     General:  He looks systemically well. He does not appear to be in any acute pain.  Eyes: No pallor. No jaundice.  ENT: No abnormalities.  Neck: No lymphadenopathy.  Cardiovascular: Heart sounds are present without gallop rhythm. There is no pericardial rub.  Respiratory: Lung fields are clear. There is no pleural rub.  Abdomen: Soft, nontender. No masses.  Skin: No rash.  Musculoskeletal: He is tender in the left lower chest, reproducing the pain.  Psychiatric: Appropriate affect.  Neurologic: Alert and orientated, no focal neurological signs.  Labs on Admission:  Basic Metabolic Panel:  Recent Labs Lab 09/08/12 0928  NA 139  K 3.9  CL 105  CO2 23  GLUCOSE 111*  BUN 10  CREATININE 0.95  CALCIUM 9.9       CBC:  Recent Labs Lab 09/08/12 0928  WBC 7.9  NEUTROABS 5.7  HGB 15.7  HCT 44.7  MCV 81.6  PLT 168   Cardiac Enzymes:  Recent Labs Lab 09/08/12 0928  TROPONINI <0.30      Radiological Exams on Admission: Dg Chest Portable 1 View  09/08/2012  *RADIOLOGY REPORT*  Clinical Data: Chest pain.  PORTABLE CHEST - 1 VIEW  Comparison: Chest x-ray 11/26/2008.  Findings: Lung volumes are normal.  No consolidative airspace disease.  No pleural effusions.  There is an ill-defined nodular opacity  projecting over the lateral aspect of the right upper lobe (immediately superior to the patient's right-sided EKG lead), which is new compared to the prior study from 11/26/2008.  Mild pulmonary venous congestion, without frank pulmonary edema.  Heart size is upper limits of normal.  Upper mediastinal contours are unremarkable.  IMPRESSION: 1.  Mild cardiomegaly with mild pulmonary venous congestion, but no frank pulmonary edema. 2.  New nodular opacity projecting over the lateral aspect of the right upper lobe.  While this could certainly be of infectious or inflammatory etiology, underlying neoplasm is difficult to exclude. Attention on a short term follow-up PA and lateral chest radiograph in 1-2 months is recommended to ensure the stability or resolution of this finding.   Original Report Authenticated By: Trudie Reed, M.D.     EKG: Independently reviewed. Normal sinus rhythm, no acute ST-T wave changes.  Assessment/Plan   1. Left-sided chest pain, possibly musculoskeletal. Several risk factors for coronary heart disease. 2. Hypertension.  3. Previous history of coronary artery disease.  Plan: 1. Admit to telemetry. 2. Serial cardiac enzymes. 3. Hopefully discharge home tomorrow if enzymes are negative. Otherwise he will need further cardiac evaluation.   Code Status: Full code.  Family Communication: Discussed plan with patient at the bedside.   Disposition Plan: Home when medically stable.  Time spent: 45 minutes.  Wilson Singer [facility] Pager [phone].  If 7PM-7AM, please contact night-coverage [email or web address] Password [facility] 09/08/2012, 12:28 PM

## 2012-09-08 NOTE — Progress Notes (Signed)
Nutrition Brief Note  Patient identified on the Malnutrition Screening Tool (MST) Report  There is no height or weight on file to calculate BMI.   Wt Readings from Last 10 Encounters:  No data found for Wt   Current diet order is Heart Healthy, patient is consuming approximately n/a% of meals at this time. Labs and medications reviewed.   Pt reports good appetite and no changes in meal pattern however he has gradually be decreasing his wt (14-23#) without trying. 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  No nutrition interventions warranted at this time. If nutrition issues arise, please consult RD.   Royann Shivers MS,RD,LDN,CSG Office: [phone] Pager: [phone]

## 2012-09-08 NOTE — ED Provider Notes (Signed)
History     CSN: [id number]  Arrival date & time 09/08/12  0905   First MD Initiated Contact with Patient 09/08/12 [phone]      Chief Complaint  Patient presents with  . Chest Pain    (Consider location/radiation/quality/duration/timing/severity/associated sxs/prior treatment) HPI Comments: Patient with hx of cardiac stent placed in the late 90's c/o burning sensation to his left chest, mid back and down both legs that began gradually after shoveling snow this morning.  States that he was driving to work and noticed a "burning sensation all over" and became "weak, shaky and anxious".  States he pulled to side of the road and tried to relax, pain improved but still decided to called 911.  Was given 3 baby aspirin by EMS PTA.  Patient states the pain feels similar to previous pain he has with a hiatal hernia.  Usually take Prilosec but forgot to take it this morning.  He denies shortness of breath,  diaphoresis, vomiting, nausea, pain or numbness of the UE's.   Patient PMD is Dr. Regino Schultze.  Had a cardiac catheterization 2-3 years ago that he states was normal.    Patient is a 52 y.o. male presenting with chest pain. The history is provided by the patient.  Chest Pain Pain location:  L lateral chest Pain quality: burning and pressure   Pain radiates to:  Epigastrium and mid back (and "down both legs") Pain severity:  Mild Onset quality:  Gradual Duration:  1 hour Timing:  Constant Progression:  Improving Chronicity:  Recurrent Context: not breathing, no drug use, not raising an arm and no trauma   Context comment:  Burning sensation to left chest began after shoveling snow. Relieved by:  Certain positions Worsened by:  Certain positions, movement and exertion Ineffective treatments:  Aspirin Associated symptoms: abdominal pain, back pain, numbness and weakness   Associated symptoms: no altered mental status, no cough, no diaphoresis, no dizziness, no dysphagia, no fever, no headache, no  lower extremity edema, no nausea, no orthopnea, no palpitations, no shortness of breath, no syncope and not vomiting   Risk factors: high cholesterol and male sex   Risk factors: no aortic disease, no diabetes mellitus, no hypertension, no immobilization, not obese, no prior DVT/PE and no surgery     Past Medical History  Diagnosis Date  . Hypertension     Past Surgical History  Procedure Laterality Date  . Coronary angioplasty with stent placement    . Cholecystectomy    . Appendectomy    . Vasectomy      No family history on file.  History  Substance Use Topics  . Smoking status: Former Games developer  . Smokeless tobacco: Not on file  . Alcohol Use: Yes     Comment: rare      Review of Systems  Constitutional: Negative for fever, chills, diaphoresis, activity change and appetite change.  HENT: Negative for trouble swallowing, neck pain and neck stiffness.   Eyes: Negative for visual disturbance.  Respiratory: Negative for cough, chest tightness, shortness of breath, wheezing and stridor.   Cardiovascular: Positive for chest pain. Negative for palpitations, orthopnea, leg swelling and syncope.  Gastrointestinal: Positive for abdominal pain. Negative for nausea, vomiting and diarrhea.  Genitourinary: Negative for dysuria, frequency and flank pain.  Musculoskeletal: Positive for back pain.  Skin: Negative for color change and rash.  Neurological: Positive for weakness and numbness. Negative for dizziness, tremors, seizures, syncope, speech difficulty and headaches.  Hematological: Negative for adenopathy.  Psychiatric/Behavioral: Negative  for confusion and altered mental status.  All other systems reviewed and are negative.    Allergies  Statins and Levofloxacin  Home Medications   Current Outpatient Rx  Name  Route  Sig  Dispense  Refill  . aspirin 81 MG tablet   Oral   Take 81 mg by mouth daily.         . fluticasone (FLONASE) 50 MCG/ACT nasal spray   Nasal    Place 2 sprays into the nose daily.         Marland Kitchen olmesartan (BENICAR) 20 MG tablet   Oral   Take 20 mg by mouth daily.         Marland Kitchen omeprazole (PRILOSEC OTC) 20 MG tablet   Oral   Take 20 mg by mouth daily.         . tamsulosin (FLOMAX) 0.4 MG CAPS   Oral   Take 0.4 mg by mouth daily.           BP 123/79  Pulse 69  Temp(Src) 98.3 F (36.8 C) (Oral)  SpO2 99%  Physical Exam  Nursing note and vitals reviewed. Constitutional: He is oriented to person, place, and time. He appears well-developed and well-nourished. No distress.  Pt is well appearing  HENT:  Head: Normocephalic and atraumatic.  Mouth/Throat: Oropharynx is clear and moist.  Eyes: EOM are normal. Pupils are equal, round, and reactive to light.  Neck: Normal range of motion. Neck supple. No JVD present. No thyromegaly present.  Cardiovascular: Normal rate, regular rhythm, normal heart sounds and intact distal pulses.   No murmur heard. Pulmonary/Chest: Effort normal and breath sounds normal. No respiratory distress. He has no decreased breath sounds. He has no wheezes. He exhibits tenderness. He exhibits no mass, no bony tenderness, no laceration, no crepitus, no edema, no deformity, no swelling and no retraction.    Localized ttp of the left antero lateral ribs.  No guarding or crepitus on exam.    Abdominal: Soft. Normal appearance and bowel sounds are normal. He exhibits no distension and no fluid wave. There is no hepatosplenomegaly. There is tenderness in the left upper quadrant. There is no rigidity, no rebound, no guarding, no CVA tenderness and no tenderness at McBurney's point.    ttp of the left upper abdomen and lower portion of the left antero lateral ribs  Musculoskeletal: Normal range of motion.  Neurological: He is alert and oriented to person, place, and time. He exhibits normal muscle tone. Coordination normal.  Skin: Skin is warm and dry.    ED Course  Procedures (including critical care  time)  Labs Reviewed - No data to display No results found. Results for orders placed during the hospital encounter of 09/08/12  CBC WITH DIFFERENTIAL      Result Value Range   WBC 7.9  4.0 - 10.5 K/uL   RBC 5.48  4.22 - 5.81 MIL/uL   Hemoglobin 15.7  13.0 - 17.0 g/dL   HCT 40.9  81.1 - 91.4 %   MCV 81.6  78.0 - 100.0 fL   MCH 28.6  26.0 - 34.0 pg   MCHC 35.1  30.0 - 36.0 g/dL   RDW 78.2  95.6 - 21.3 %   Platelets 168  150 - 400 K/uL   Neutrophils Relative 72  43 - 77 %   Neutro Abs 5.7  1.7 - 7.7 K/uL   Lymphocytes Relative 18  12 - 46 %   Lymphs Abs 1.4  0.7 - 4.0  K/uL   Monocytes Relative 6  3 - 12 %   Monocytes Absolute 0.5  0.1 - 1.0 K/uL   Eosinophils Relative 3  0 - 5 %   Eosinophils Absolute 0.3  0.0 - 0.7 K/uL   Basophils Relative 0  0 - 1 %   Basophils Absolute 0.0  0.0 - 0.1 K/uL  BASIC METABOLIC PANEL      Result Value Range   Sodium 139  135 - 145 mEq/L   Potassium 3.9  3.5 - 5.1 mEq/L   Chloride 105  96 - 112 mEq/L   CO2 23  19 - 32 mEq/L   Glucose, Bld 111 (*) 70 - 99 mg/dL   BUN 10  6 - 23 mg/dL   Creatinine, Ser 9.60  0.50 - 1.35 mg/dL   Calcium 9.9  8.4 - 45.4 mg/dL   GFR calc non Af Amer >90  >90 mL/min   GFR calc Af Amer >90  >90 mL/min  TROPONIN I      Result Value Range   Troponin I <0.30  <0.30 ng/mL    Dg Chest Portable 1 View  09/08/2012  *RADIOLOGY REPORT*  Clinical Data: Chest pain.  PORTABLE CHEST - 1 VIEW  Comparison: Chest x-ray 11/26/2008.  Findings: Lung volumes are normal.  No consolidative airspace disease.  No pleural effusions.  There is an ill-defined nodular opacity projecting over the lateral aspect of the right upper lobe (immediately superior to the patient's right-sided EKG lead), which is new compared to the prior study from 11/26/2008.  Mild pulmonary venous congestion, without frank pulmonary edema.  Heart size is upper limits of normal.  Upper mediastinal contours are unremarkable.  IMPRESSION: 1.  Mild cardiomegaly with mild  pulmonary venous congestion, but no frank pulmonary edema. 2.  New nodular opacity projecting over the lateral aspect of the right upper lobe.  While this could certainly be of infectious or inflammatory etiology, underlying neoplasm is difficult to exclude. Attention on a short term follow-up PA and lateral chest radiograph in 1-2 months is recommended to ensure the stability or resolution of this finding.   Original Report Authenticated By: Trudie Reed, M.D.     MDM    Date: 09/08/2012  Rate: 62  Rhythm: normal sinus rhythm and sinus arrhythmia  QRS Axis: normal  Intervals: normal  ST/T Wave abnormalities: normal  Conduction Disutrbances:none  Narrative Interpretation:   Old EKG Reviewed: no significant changes from 09-26-08    EKG reviewed by Dr. Adriana Simas   Pain improved somewhat after morphine, protonix and one SL NTG but continues to c/o "discomfort" to the left lower chest.  I have discussed pt hx and care plan with the EDP.  Pt also seen by EDP.  Given pt's hx and significant risk factors, I will call hospitalist to admit and order one inch of NTG paste.  Vitals remain stable   11:53 AM consulted Dr. Karilyn Cota , will see pt in ED for chest pain admit    Tammy L. Triplett, PA-C 09/08/12 1229

## 2012-09-09 DIAGNOSIS — R0789 Other chest pain: Secondary | ICD-10-CM

## 2012-09-09 NOTE — Progress Notes (Signed)
Patient given discharge instructions with no questions. Instructed to f/u with primary. Instructed to call 911 for any emergency. Patient left in stable condition with wife. Ambulated out of facility with staff supervision.

## 2012-09-09 NOTE — Discharge Summary (Signed)
Physician Discharge Summary  [name]:[id number] DOB: 1960-08-15 DOA: 09/08/2012  PCP: Kirk Ruths, MD  Admit date: 09/08/2012 Discharge date: 09/09/2012  Time spent: Greater than 30 minutes  Recommendations for Outpatient Follow-up:  1. Followup with cardiology in the next week or 2.   Discharge Diagnoses:  1. Chest pain left-sided, likely musculoskeletal. Serial cardiac enzymes negative. 2. Hypertension, controlled. 3. History of coronary artery disease, currently stable.   Discharge Condition: Stable. Improved.  Diet recommendation: Heart healthy.  Filed Weights   09/08/12 2007  Weight: 108.636 kg (239 lb 8 oz)    History of present illness:  This very pleasant 52 year old man presented to the hospital with symptoms of left-sided chest pain. Please see initial history as outlined below: HPI: [name] is a 52 y.o. male complains of left-sided chest pain which started 8 AM this morning after he had been shoveling some snow. For the last 2 weeks or so years been describing left-sided upper abdominal pain, possibly left-sided lower chest pain which she relates to his hiatal hernia and Prilosec apparently has helped this symptom. This morning, after shoveling snow, he started having left-sided chest pain. This was associated with central burning chest pain radiating to both his arms, this lasted only seconds, definitely less than 1 minute. It was associated with profuse sweating.  He does have a history of coronary artery disease, having had cardiac catheterization and stenting procedure in the 1990s. He is in next smoker, having quit 16 years ago. He does have a strong family history of early coronary artery disease. For these reasons, he is now being admitted for observation.  Hospital Course:  Patient was admitted overnight and his chest pain actually has improved significantly. Of note, he was tender in the left side of the chest, reproducing his pain I really think  this was muscular in nature rather than cardiac. Serial troponin levels are all negative. He does admit that he has not seen his cardiologist for over a year and I recommended that he should do so, he may probably benefit from repeat stress test. He is now stable for discharge  Procedures:  None.   Consultations:  None.  Discharge Exam: Filed Vitals:   09/08/12 1300 09/08/12 1406 09/08/12 2007 09/09/12 0626  BP: 85/55 94/56 96/56  104/62  Pulse: 68 60 53 60  Temp:   97.9 F (36.6 C) 98 F (36.7 C)  TempSrc:   Oral Oral  Resp: 15 18 18 18   Height:   6\' 2"  (1.88 m)   Weight:   108.636 kg (239 lb 8 oz)   SpO2: 97% 98% 96% 97%    General: He looks systemically well. Cardiovascular: Heart sounds are present without gallop rhythm. Respiratory: Lung fields are clear. He is alert and orientated.  Discharge Instructions  Discharge Orders   Future Orders Complete By Expires     Diet - low sodium heart healthy  As directed     Increase activity slowly  As directed         Medication List    TAKE these medications       aspirin 81 MG tablet  Take 81 mg by mouth daily.     fluticasone 50 MCG/ACT nasal spray  Commonly known as:  FLONASE  Place 2 sprays into the nose daily.     olmesartan 20 MG tablet  Commonly known as:  BENICAR  Take 20 mg by mouth daily.     omeprazole 20 MG tablet  Commonly  known as:  PRILOSEC OTC  Take 20 mg by mouth daily.     tamsulosin 0.4 MG Caps  Commonly known as:  FLOMAX  Take 0.4 mg by mouth daily.          The results of significant diagnostics from this hospitalization (including imaging, microbiology, ancillary and laboratory) are listed below for reference.    Significant Diagnostic Studies: Dg Chest Portable 1 View  09/08/2012  *RADIOLOGY REPORT*  Clinical Data: Chest pain.  PORTABLE CHEST - 1 VIEW  Comparison: Chest x-ray 11/26/2008.  Findings: Lung volumes are normal.  No consolidative airspace disease.  No pleural effusions.   There is an ill-defined nodular opacity projecting over the lateral aspect of the right upper lobe (immediately superior to the patient's right-sided EKG lead), which is new compared to the prior study from 11/26/2008.  Mild pulmonary venous congestion, without frank pulmonary edema.  Heart size is upper limits of normal.  Upper mediastinal contours are unremarkable.  IMPRESSION: 1.  Mild cardiomegaly with mild pulmonary venous congestion, but no frank pulmonary edema. 2.  New nodular opacity projecting over the lateral aspect of the right upper lobe.  While this could certainly be of infectious or inflammatory etiology, underlying neoplasm is difficult to exclude. Attention on a short term follow-up PA and lateral chest radiograph in 1-2 months is recommended to ensure the stability or resolution of this finding.   Original Report Authenticated By: Trudie Reed, M.D.         Labs: Basic Metabolic Panel:  Recent Labs Lab 09/08/12 0928  NA 139  K 3.9  CL 105  CO2 23  GLUCOSE 111*  BUN 10  CREATININE 0.95  CALCIUM 9.9       CBC:  Recent Labs Lab 09/08/12 0928  WBC 7.9  NEUTROABS 5.7  HGB 15.7  HCT 44.7  MCV 81.6  PLT 168   Cardiac Enzymes:  Recent Labs Lab 09/08/12 0928 09/08/12 1128 09/08/12 1858 09/08/12 2349  TROPONINI <0.30 <0.30 <0.30 <0.30       Signed:  Lilly Cove C  [facility] 09/09/2012, 9:56 AM

## 2012-09-20 NOTE — Progress Notes (Signed)
UR Chart Review Completed  

## 2012-09-28 ENCOUNTER — Other Ambulatory Visit (HOSPITAL_COMMUNITY): Payer: Self-pay | Admitting: Cardiovascular Disease

## 2012-09-28 DIAGNOSIS — R079 Chest pain, unspecified: Secondary | ICD-10-CM

## 2012-10-12 ENCOUNTER — Ambulatory Visit (HOSPITAL_COMMUNITY)
Admission: RE | Admit: 2012-10-12 | Discharge: 2012-10-12 | Disposition: A | Discharge status: Home / Self Care | Payer: Federal, State, Local not specified - PPO | Source: Ambulatory Visit | Attending: Cardiovascular Disease | Admitting: Cardiovascular Disease

## 2012-10-12 DIAGNOSIS — R0989 Other specified symptoms and signs involving the circulatory and respiratory systems: Secondary | ICD-10-CM | POA: Insufficient documentation

## 2012-10-12 DIAGNOSIS — I10 Essential (primary) hypertension: Secondary | ICD-10-CM | POA: Insufficient documentation

## 2012-10-12 DIAGNOSIS — R42 Dizziness and giddiness: Secondary | ICD-10-CM | POA: Insufficient documentation

## 2012-10-12 DIAGNOSIS — R5381 Other malaise: Secondary | ICD-10-CM | POA: Insufficient documentation

## 2012-10-12 DIAGNOSIS — R0609 Other forms of dyspnea: Secondary | ICD-10-CM | POA: Insufficient documentation

## 2012-10-12 DIAGNOSIS — R079 Chest pain, unspecified: Secondary | ICD-10-CM | POA: Insufficient documentation

## 2012-10-12 DIAGNOSIS — Z8249 Family history of ischemic heart disease and other diseases of the circulatory system: Secondary | ICD-10-CM | POA: Insufficient documentation

## 2012-10-12 DIAGNOSIS — Z87891 Personal history of nicotine dependence: Secondary | ICD-10-CM | POA: Insufficient documentation

## 2012-10-12 MED ORDER — TECHNETIUM TC 99M SESTAMIBI GENERIC - CARDIOLITE
10.6000 | Freq: Once | INTRAVENOUS | Status: AC | PRN
  Administered 2012-10-12: 11 via INTRAVENOUS

## 2012-10-12 MED ORDER — TECHNETIUM TC 99M SESTAMIBI GENERIC - CARDIOLITE
30.4000 | Freq: Once | INTRAVENOUS | Status: AC | PRN
  Administered 2012-10-12: 30 via INTRAVENOUS

## 2012-10-12 NOTE — Procedures (Addendum)
[facility] [address] [place] [address] [phone]  Cardiology Nuclear Med Study  [name] is a 52 y.o. male     MRN : [id number]     DOB: 1960-08-30  Procedure Date: 10/12/2012  Nuclear Med Background Indication for Stress Test:  Stent Patency and Post Hospital History:  CAD;STENT/PTCA--1998 Cardiac Risk Factors: Family History - CAD, History of Smoking, Hypertension, Lipids and Overweight  Symptoms:  Chest Pain, Dizziness, DOE, Fatigue and Light-Headedness   Nuclear Pre-Procedure Caffeine/Decaff Intake:  10:00pm NPO After: 8:00am   IV Site: R Hand  IV 0.9% NS with Angio Cath:  22g  Chest Size (in):  50 IV Started by: Emmit Pomfret, RN  Height: 6\' 2"  (1.88 m)  Cup Size: n/a  BMI:  Body mass index is 30.42 kg/(m^2). Weight:  237 lb (107.502 kg)   Tech Comments:  N/A    Nuclear Med Study 1 or 2 day study: 1 day  Stress Test Type:  Stress  Order Authorizing Provider:  Susa Griffins, MD    Resting Radionuclide: Technetium 26m Sestamibi  Resting Radionuclide Dose: 10.6 mCi   Stress Radionuclide:  Technetium 18m Sestamibi  Stress Radionuclide Dose: 30.4 mCi           Stress Protocol Rest HR: 74 Stress HR:  151  Rest BP:  107/81 Stress BP:  154/69  Exercise Time (min): 9:00 METS: 10.4   Predicted Max HR: 166 bpm % Max HR: 90.96 bpm Rate Pressure Product: 21308  Dose of Adenosine (mg):  n/a Dose of Lexiscan: n/a mg  Dose of Atropine (mg): n/a Dose of Dobutamine: n/a mcg/kg/min (at max HR)  Stress Test Technologist: [name], CCT Nuclear Technologist: [name], CNMT   Rest Procedure:  Myocardial perfusion imaging was performed at rest 45 minutes following the intravenous administration of Technetium 67m Sestamibi. Stress Procedure:  The patient performed treadmill exercise using a Bruce  Protocol for 9:00 minutes. The patient stopped due to SOB and Fatigue and denied any chest pain.   There were no significant ST-T wave changes.  Technetium 68m Sestamibi was injected at peak exercise and myocardial perfusion imaging was performed after a brief delay.  Transient Ischemic Dilatation (Normal <1.22):  0.93 Lung/Heart Ratio (Normal <0.45):  0.34 QGS EDV:  101 ml QGS ESV:  44 ml LV Ejection Fraction: 56%  Signed by [name], CNMT  PHYSICIAN INTERPRETATION  Rest ECG: NSR - Normal EKG  Stress ECG: Insignificant upsloping ST segment depression. Inferior and lateral leads  QPS Raw Data Images:  There is interference from nuclear activity from structures below the diaphragm. This does affect the ability to read the study -- rest images have a large loop of bowel adjacent to the inferolateral wall leading to increased perfusion intensity in this region as compared to rest images.  Normal left ventricular size. Stress Images:  There is decreased uptake in the inferior and inferolateral wall -- a small sized, mild intensity defect, most noted in the inferoapical region; There is also decreased uptake in the anterior wall --  a moderate sized, very mild perfusion defect . Rest Images:  There is decreased uptake in the anterior wall; There is decreased uptake in the inferior wall. Subtraction (SDS):  There does apear to be mild reversibility in the a small inferoapical segment.  Cannot exclude artifact from bowel artifact in rest and not stress images. ; The anterior defect is essentially fixed and mild -- suggestive of  possible soft tissue attenuation or epicardial scar.  Impression Exercise Capacity:  Good exercise capacity. BP Response:  Normal blood pressure response. Clinical Symptoms:  There is dyspnea. ECG Impression:  Insignificant upsloping ST segment depression. LV Wall Motion:  NL LV Function; NL Wall Motion with mildly reduced thickening in the inferoapex.  Comparison with Prior Nuclear Study: No significant change from previous study -- the observed inferio defect  was attributed to diaphragmatic attenuation, and the (now less prominent) anterior defect was considered to be consistent with possible ischemia that was found to be a false positive on cardiac catheterization.  Overall Impression: Abnormal nuclear perfusion imaging with little evidence of reversible perfusion defects consistent with significant ischemia. Low risk stress nuclear study. Subdiaphragmatic tissue attenuation limits the interpretation of rest vs. Stress images. Clinical correlation is warranted.     Marykay Lex, MD  10/13/2012 5:00 PM

## 2013-02-23 ENCOUNTER — Ambulatory Visit (INDEPENDENT_AMBULATORY_CARE_PROVIDER_SITE_OTHER): Payer: Federal, State, Local not specified - PPO | Admitting: Urology

## 2013-02-23 DIAGNOSIS — N401 Enlarged prostate with lower urinary tract symptoms: Secondary | ICD-10-CM

## 2013-02-23 DIAGNOSIS — R972 Elevated prostate specific antigen [PSA]: Secondary | ICD-10-CM

## 2013-02-23 DIAGNOSIS — N529 Male erectile dysfunction, unspecified: Secondary | ICD-10-CM

## 2013-03-07 ENCOUNTER — Encounter (INDEPENDENT_AMBULATORY_CARE_PROVIDER_SITE_OTHER): Payer: Self-pay | Admitting: *Deleted

## 2013-03-13 ENCOUNTER — Encounter: Payer: Self-pay | Admitting: Cardiovascular Disease

## 2013-03-27 ENCOUNTER — Other Ambulatory Visit (INDEPENDENT_AMBULATORY_CARE_PROVIDER_SITE_OTHER): Payer: Self-pay | Admitting: *Deleted

## 2013-03-27 ENCOUNTER — Encounter (INDEPENDENT_AMBULATORY_CARE_PROVIDER_SITE_OTHER): Payer: Self-pay | Admitting: Internal Medicine

## 2013-03-27 ENCOUNTER — Ambulatory Visit (INDEPENDENT_AMBULATORY_CARE_PROVIDER_SITE_OTHER): Payer: Federal, State, Local not specified - PPO | Admitting: Internal Medicine

## 2013-03-27 ENCOUNTER — Encounter (INDEPENDENT_AMBULATORY_CARE_PROVIDER_SITE_OTHER): Payer: Self-pay | Admitting: *Deleted

## 2013-03-27 VITALS — BP 126/74 | HR 64 | Temp 97.0°F | Ht 74.0 in | Wt 248.5 lb

## 2013-03-27 DIAGNOSIS — R1012 Left upper quadrant pain: Secondary | ICD-10-CM

## 2013-03-27 DIAGNOSIS — K219 Gastro-esophageal reflux disease without esophagitis: Secondary | ICD-10-CM

## 2013-03-27 NOTE — Progress Notes (Signed)
Subjective:     Patient ID: [name], male   DOB: 1961-04-25, 52 y.o.   MRN: [id number]  HPI Referred to our office for GERD.(Second opinion).  HHe tells me has acid reflux. He recently placed on Nexium which has helped his acid reflux. He has to sleep on pillows so he want have acid reflux. He has acid reflux once a week or every two weeks. If he eats plain, bland food,  GERD is controlled. He does have frequent burping.  His appetite is good. No weight loss. He denies melena or bright red rectal bleeding.  He c/o left upper quadrant pain. He says he can palpate the area and no pain but he cannot lay on his left side. Symptoms for at least 10 yrs.  (Per Dr. Ewing Schlein notes symptoms for a year).  He saw Dr. Ewing Schlein a couple of months ago for this same problem.  He was placed on Nexium which helped some. He then saw Dr. Regino Schultze. He had pain left upper quadrant with burping. He was placed on Carafate which helped. He has been on the Carafate since April.  Presently taking Protonix for his GERD which was prescribed by Dr. Alanda Amass.   He had an EGD and colonoscopy by Dr. Darlen Round in 2010 and both were normal.  Biopsy:  COLON, ASCENDING, POLYP: - HYPERPLASTIC POLYP. - THERE IS NO EVIDENCE OF MALIGNANCY.    09/24/2011 MR abdomen: for abdominal pain:   IMPRESSION:  1. 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 This is a Bosniak II lesion which  requires no follow-up.  2. Additional benign simple cyst within the left kidney.  3. Benign hepatic lesions.  09/22/2008 MRCP: abdominal pain and elevated LFTs.  extrahepatic common duct to a degree in keeping with the spectrum  often seen in patients after cholecystectomy. No evidence for  ductal stone.  Several tiny hepatic cysts with a 1.4 cm lesion in the upper  posterior right liver which is probably a cavernous hemangioma.  While this cannot be definitively characterized on today's exam, in  a  patient with no cancer history, a lesion of this size has a very  high probability of being benign. 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  2.4 cm simple cyst in the left kidney.  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 He is married. He has 2 children in good health.  Review of Systems see hpi Current Outpatient Prescriptions  Medication Sig Dispense Refill  . aspirin 81 MG tablet Take 81 mg by mouth daily.      Marland Kitchen augmented betamethasone dipropionate (DIPROLENE-AF) 0.05 % cream Apply topically 2 (two) times daily.      . fluticasone (FLONASE) 50 MCG/ACT nasal spray Place 2 sprays into the nose daily.      Marland Kitchen olmesartan (BENICAR) 20 MG tablet Take 20 mg by mouth daily.      . pantoprazole (PROTONIX) 40 MG tablet Take 40 mg by mouth daily.      . sucralfate (CARAFATE) 1 G tablet Take 1 g by mouth 4 (four) times daily.      . tamsulosin (FLOMAX) 0.4 MG CAPS Take 0.4 mg by mouth daily.       No current facility-administered medications for this visit.   Past Medical History  Diagnosis Date  . Hypertension   . BPH (benign prostatic hyperplasia)    Allergies  Allergen Reactions  . Statins Other (See Comments)    Muscles hurt.  Marland Kitchen  Levofloxacin Palpitations   Past Surgical History  Procedure Laterality Date  . Coronary angioplasty with stent placement      November 1998  . Cholecystectomy    . Appendectomy    . Vasectomy         Objective:   Physical Exam  Filed Vitals:   03/27/13 1517  BP: 126/74  Pulse: 64  Temp: 97 F (36.1 C)  Height: 6\' 2"  (1.88 m)  Weight: 248 lb 8 oz (112.719 kg)   Alert and oriented. Skin warm and dry. Oral mucosa is moist.   . Sclera anicteric, conjunctivae is pink. Thyroid not enlarged. No cervical lymphadenopathy. Lungs clear. Heart regular rate and rhythm.  Abdomen is soft. Bowel sounds are positive. No hepatomegaly. No abdominal masses felt. No tenderness.  No edema to lower extremities.       Assessment:    GERD. He continues to have some break  thru. Maintained on Protonix and Carafate at this time. PUD needs to be ruled out.     Plan:      EGD with Dr. Karilyn Cota. If normal, may need CT abdomen for his left upper quadrant pain.

## 2013-03-27 NOTE — Patient Instructions (Addendum)
EGD with Dr. Rehman. 

## 2013-03-30 ENCOUNTER — Encounter (HOSPITAL_COMMUNITY): Payer: Self-pay | Admitting: Pharmacy Technician

## 2013-04-12 ENCOUNTER — Ambulatory Visit (HOSPITAL_COMMUNITY)
Admission: RE | Admit: 2013-04-12 | Discharge: 2013-04-12 | Disposition: A | Discharge status: Home / Self Care | Payer: Federal, State, Local not specified - PPO | Source: Ambulatory Visit | Attending: Internal Medicine | Admitting: Internal Medicine

## 2013-04-12 ENCOUNTER — Encounter (HOSPITAL_COMMUNITY): Payer: Self-pay | Admitting: *Deleted

## 2013-04-12 ENCOUNTER — Encounter (HOSPITAL_COMMUNITY)
Admission: RE | Disposition: A | Discharge status: Home / Self Care | Payer: Self-pay | Source: Ambulatory Visit | Attending: Internal Medicine

## 2013-04-12 DIAGNOSIS — R1012 Left upper quadrant pain: Secondary | ICD-10-CM

## 2013-04-12 DIAGNOSIS — K2289 Other specified disease of esophagus: Secondary | ICD-10-CM

## 2013-04-12 DIAGNOSIS — I10 Essential (primary) hypertension: Secondary | ICD-10-CM | POA: Insufficient documentation

## 2013-04-12 DIAGNOSIS — D131 Benign neoplasm of stomach: Secondary | ICD-10-CM

## 2013-04-12 DIAGNOSIS — K228 Other specified diseases of esophagus: Secondary | ICD-10-CM

## 2013-04-12 DIAGNOSIS — K219 Gastro-esophageal reflux disease without esophagitis: Secondary | ICD-10-CM

## 2013-04-12 HISTORY — PX: ESOPHAGOGASTRODUODENOSCOPY: SHX5428

## 2013-04-12 SURGERY — EGD (ESOPHAGOGASTRODUODENOSCOPY)
Anesthesia: Moderate Sedation

## 2013-04-12 MED ORDER — STERILE WATER FOR IRRIGATION IR SOLN
Status: DC | PRN
  Administered 2013-04-12: 14:00:00

## 2013-04-12 MED ORDER — MIDAZOLAM HCL 5 MG/5ML IJ SOLN
INTRAMUSCULAR | Status: AC
  Filled 2013-04-12: qty 10

## 2013-04-12 MED ORDER — MIDAZOLAM HCL 5 MG/5ML IJ SOLN
INTRAMUSCULAR | Status: DC | PRN
  Administered 2013-04-12: 1 mg via INTRAVENOUS
  Administered 2013-04-12 (×2): 2 mg via INTRAVENOUS

## 2013-04-12 MED ORDER — SODIUM CHLORIDE 0.9 % IV SOLN
INTRAVENOUS | Status: DC
  Administered 2013-04-12: 1000 mL via INTRAVENOUS

## 2013-04-12 MED ORDER — BUTAMBEN-TETRACAINE-BENZOCAINE 2-2-14 % EX AERO
INHALATION_SPRAY | CUTANEOUS | Status: DC | PRN
  Administered 2013-04-12: 2 via TOPICAL

## 2013-04-12 MED ORDER — MEPERIDINE HCL 50 MG/ML IJ SOLN
INTRAMUSCULAR | Status: AC
  Filled 2013-04-12: qty 1

## 2013-04-12 MED ORDER — MEPERIDINE HCL 50 MG/ML IJ SOLN
INTRAMUSCULAR | Status: DC | PRN
Start: 2013-04-12 — End: 2013-04-12
  Administered 2013-04-12 (×2): 25 mg via INTRAVENOUS

## 2013-04-12 NOTE — Progress Notes (Signed)
h pylori sent to lab

## 2013-04-12 NOTE — H&P (Signed)
[name] is an 52 y.o. male.  Chief Complaint: Patient is here for diagnostic EGD. HPI: Patient is 52 year old Caucasian male with chronic GERD and presents with intermittent heartburn and left upper quadrant abdominal pain. Heartburn has responded to therapy much better than this pain has. He has had this pain off and on for several months. Meals seem to decrease severity of his pain. He denies dysphagia nausea vomiting melena or rectal bleeding. He is feeling better since he's been on Carafate but not 100%.  Past Medical History  Diagnosis Date  . Hypertension   . BPH (benign prostatic hyperplasia)     Past Surgical History  Procedure Laterality Date  . Coronary angioplasty with stent placement      November 1998  . Cholecystectomy    . Appendectomy    . Vasectomy      History reviewed. No pertinent family history. Social History:  reports that he has quit smoking. He does not have any smokeless tobacco history on file. He reports that he drinks about 1.2 ounces of alcohol per week. He reports that he does not use illicit drugs.  Allergies:  Allergies  Allergen Reactions  . Statins Other (See Comments)    Muscles hurt.  . Levofloxacin Palpitations    Medications Prior to Admission  Medication Sig Dispense Refill  . aspirin 81 MG tablet Take 81 mg by mouth daily.      Marland Kitchen augmented betamethasone dipropionate (DIPROLENE-AF) 0.05 % cream Apply topically 2 (two) times daily.      . fluticasone (FLONASE) 50 MCG/ACT nasal spray Place 2 sprays into the nose daily.      Marland Kitchen olmesartan (BENICAR) 20 MG tablet Take 20 mg by mouth daily.      . pantoprazole (PROTONIX) 40 MG tablet Take 40 mg by mouth daily.      . sucralfate (CARAFATE) 1 G tablet Take 1 g by mouth 4 (four) times daily.      . tamsulosin (FLOMAX) 0.4 MG CAPS Take 0.4 mg by mouth daily.        No results found for this or any previous visit (from the past 48 hour(s)). No results found.  ROS  Blood pressure 124/82,  pulse 62, temperature 97.9 F (36.6 C), temperature source Oral, resp. rate 14, height 6\' 2"  (1.88 m), weight 248 lb (112.492 kg), SpO2 96.00%. Physical Exam  Constitutional: He appears well-developed and well-nourished.  HENT:  Mouth/Throat: Oropharynx is clear and moist.  Eyes: Conjunctivae are normal. No scleral icterus.  Neck: No thyromegaly present.  Cardiovascular: Normal rate, regular rhythm and normal heart sounds.   No murmur heard. Respiratory: Effort normal and breath sounds normal.  GI: Soft. He exhibits no distension and no mass. There is no tenderness.  Musculoskeletal: He exhibits no edema.  Lymphadenopathy:    He has no cervical adenopathy.  Neurological: He is alert.  Skin: Skin is warm.     Assessment/Plan Chronic GERD. Epigastric and left upper quadrant abdominal pain. Diagnostic EGD.  REHMAN,NAJEEB U 04/12/2013, 2:17 PM

## 2013-04-12 NOTE — Op Note (Signed)
EGD PROCEDURE REPORT  PATIENT:  REAL CONA  MR#:  [id number] Birthdate:  May 01, 1961, 52 y.o., male Endoscopist:  Dr. Malissa Hippo, MD Referred By:  Dr. Kirk Ruths, MD Procedure Date: 04/12/2013  Procedure:   EGD  Indications:  Patient is 52 year old Caucasian male who presents with heartburn and epigastric and left upper quadrant abdominal pain. He he was on PPI for a few years and stopping the medication his symptoms of relapse. Symptom control is not satisfactory to this point. Also complains of intermittent epigastric and left upper quadrant pain which seems to get better with meals. He is undergoing diagnostic EGD.            Informed Consent:  The risks, benefits, alternatives & imponderables which include, but are not limited to, bleeding, infection, perforation, drug reaction and potential missed lesion have been reviewed.  The potential for biopsy, lesion removal, esophageal dilation, etc. have also been discussed.  Questions have been answered.  All parties agreeable.  Please see history & physical in medical record for more information.  Medications:  Demerol 50 mg IV Versed 5 mg IV Cetacaine spray topically for oropharyngeal anesthesia  Description of procedure:  The endoscope was introduced through the mouth and advanced to the second portion of the duodenum without difficulty or limitations. The mucosal surfaces were surveyed very carefully during advancement of the scope and upon withdrawal.  Findings:  Esophagus:  Mucosa of the esophagus was normal except 2 small focal areas of erythema proximal to GE junction suspicious for healing erosions. GE junction was unremarkable. GEJ:  45 cm Stomach:  Stomach was empty and distended very well with insufflation. Folds in the proximal stomach were normal. Examination of mucosa revealed  few 3-4 mm hyperplastic appearing polyps at  gastric body and fundus. Antral mucosa was normal. Focal edema and erythema noted the pyloric  channel which was patent. Angularis was unremarkable. Duodenum:  Normal bulbar and post bulbar mucosa.  Therapeutic/Diagnostic Maneuvers Performed:  None  Complications:  None  Impression: Focal erythema distal esophagus suggestive of healing erosions. Few small hyperplastic polyps in proximal stomach. These were left alone. Pyloric channel inflammation without ulcer or stenosis.  Source of patient's epigastric and LUQ abdominal pain remains unclear.  Recommendations:  H. pylori serology. Patient will continue pantoprazole and sucralfate for now. Symptom diary until office visit in 3-4 weeks.  REHMAN,NAJEEB U  04/12/2013  2:41 PM  CC: Dr. Kirk Ruths, MD & Dr. Bonnetta Barry ref. provider found

## 2013-04-16 ENCOUNTER — Encounter (HOSPITAL_COMMUNITY): Payer: Self-pay | Admitting: Internal Medicine

## 2013-05-09 NOTE — Progress Notes (Signed)
Apt has been scheduled for 05/15/13 with Dr. Karilyn Cota.

## 2013-05-15 ENCOUNTER — Ambulatory Visit (INDEPENDENT_AMBULATORY_CARE_PROVIDER_SITE_OTHER): Payer: Federal, State, Local not specified - PPO | Admitting: Internal Medicine

## 2013-05-15 ENCOUNTER — Encounter (INDEPENDENT_AMBULATORY_CARE_PROVIDER_SITE_OTHER): Payer: Self-pay | Admitting: Internal Medicine

## 2013-05-15 VITALS — BP 118/80 | HR 74 | Temp 97.5°F | Resp 18 | Ht 74.0 in | Wt 244.6 lb

## 2013-05-15 DIAGNOSIS — K589 Irritable bowel syndrome without diarrhea: Secondary | ICD-10-CM

## 2013-05-15 DIAGNOSIS — R1013 Epigastric pain: Secondary | ICD-10-CM

## 2013-05-15 DIAGNOSIS — K219 Gastro-esophageal reflux disease without esophagitis: Secondary | ICD-10-CM

## 2013-05-15 MED ORDER — LANSOPRAZOLE 30 MG PO CPDR: 30.0000 mg | DELAYED_RELEASE_CAPSULE | Freq: Every day | ORAL | Status: DC

## 2013-05-15 NOTE — Patient Instructions (Addendum)
Call if lansoprazole does not work he experienced side effects. High-fiber diet.

## 2013-05-15 NOTE — Progress Notes (Signed)
Presenting complaint;  Followup for GERD and epigastric pain.  Subjective:  Patient is 52 year old Caucasian male who presents for scheduled visit. He underwent EGD on 04/12/2013 for heartburn epigastric and left upper quadrant pain and suboptimal response to therapy. EGD revealed focal. Humalog and distal esophagus suggestive of healing erosions and few hyperplastic polyps. He also had pyloric channel inflammation without ulcer or stenosis. H. pylori serology was negative. He was asked to continue pantoprazole and Carafate. He states he feels lot better. He is at 2 episodes of heartburn since his EGD one month ago. One episode occurred after he ate pizza. Both 3 weeks ago he woke up with nausea and experienced heartburn and in retrospect believes he may have had 24-hour bug. He feels pantoprazole and is helping his heartburn but every time he takes his medication he has upper abdominal pain which occurs within an hour and may last for 2-3 hours. He is willing to try another PPI. Nexium and Prilosec in the past have not worked. He states since he has been on Carafate he is having less diarrhea.  Current Medications: Current Outpatient Prescriptions  Medication Sig Dispense Refill  . aspirin 81 MG tablet Take 81 mg by mouth daily.      Marland Kitchen augmented betamethasone dipropionate (DIPROLENE-AF) 0.05 % cream Apply topically 2 (two) times daily.      . fluticasone (FLONASE) 50 MCG/ACT nasal spray Place 2 sprays into the nose daily.      Marland Kitchen ibuprofen (ADVIL,MOTRIN) 200 MG tablet Take 200 mg by mouth as needed for headache.      . olmesartan (BENICAR) 20 MG tablet Take 20 mg by mouth daily.      . pantoprazole (PROTONIX) 40 MG tablet Take 40 mg by mouth daily.      . sucralfate (CARAFATE) 1 G tablet Take 1 g by mouth 4 (four) times daily.      . tamsulosin (FLOMAX) 0.4 MG CAPS Take 0.4 mg by mouth daily.       No current facility-administered medications for this visit.     Objective: Blood pressure  118/80, pulse 74, temperature 97.5 F (36.4 C), temperature source Oral, resp. rate 18, height 6\' 2"  (1.88 m), weight 244 lb 9.6 oz (110.95 kg). Patient is alert and in no acute distress. Conjunctiva is pink. Sclera is nonicteric Oropharyngeal mucosa is normal. No neck masses or thyromegaly noted. Abdomen is full but soft and nontender without organomegaly or masses. No LE edema or clubbing noted.  Labs/studies Results: H. pylori serology was negative.  Assessment:  #1. GERD. Symptoms well controlled with pantoprazole and Carafate but he appears to be having epigastric pain with pantoprazole. #2. Dyspepsia much improved with therapy. #3. Intermittent diarrhea most likely secondary to IBS. Frequency of bowel movements has decreased since he has been on sucralfate.     Plan: High-fiber diet Discontinue pantoprazole. Begin lansoprazole 30 mg by mouth every morning. Try Carafate only at bedtime or use on as-needed basis. Office visit in 6 months.

## 2013-06-19 ENCOUNTER — Encounter (INDEPENDENT_AMBULATORY_CARE_PROVIDER_SITE_OTHER): Payer: Self-pay

## 2013-07-31 ENCOUNTER — Ambulatory Visit (INDEPENDENT_AMBULATORY_CARE_PROVIDER_SITE_OTHER): Payer: Federal, State, Local not specified - PPO | Admitting: Cardiovascular Disease

## 2013-07-31 ENCOUNTER — Encounter: Payer: Self-pay | Admitting: Cardiovascular Disease

## 2013-07-31 VITALS — BP 118/80 | HR 62 | Ht 74.0 in | Wt 239.0 lb

## 2013-07-31 DIAGNOSIS — I2510 Atherosclerotic heart disease of native coronary artery without angina pectoris: Secondary | ICD-10-CM

## 2013-07-31 DIAGNOSIS — I10 Essential (primary) hypertension: Secondary | ICD-10-CM

## 2013-07-31 NOTE — Patient Instructions (Signed)
Your physician recommends that you continue on your current medications as directed. Please refer to the Current Medication list given to you today.  Your physician wants you to follow-up in: 1 year with Dr. Cooper.  You will receive a reminder letter in the mail two months in advance. If you don't receive a letter, please call our office to schedule the follow-up appointment.   

## 2013-08-01 ENCOUNTER — Encounter: Payer: Self-pay | Admitting: Cardiovascular Disease

## 2013-08-01 DIAGNOSIS — I2510 Atherosclerotic heart disease of native coronary artery without angina pectoris: Secondary | ICD-10-CM | POA: Insufficient documentation

## 2013-08-01 HISTORY — DX: Atherosclerotic heart disease of native coronary artery without angina pectoris: I25.10

## 2013-08-01 NOTE — Progress Notes (Signed)
HPI:  53 year-old male presenting to establish cardiac care. He's been followed by Dr Rollene Fare since 1998. He initially presented with chest pain and underwent stenting of his LAD in 1998. Was told he had a 75% blockage. Last heart cath was in 2010 demonstrated minimal restenosis of the LAD stent (30%) and otherwise widely patent coronaries. His LV function has been normal.   The patient is doing well. He denies chest pain, dyspnea, edema, or palpitations. He's been having some problems with abdominal discomfort and is undergoing workup by Dr Laural Golden in [place]. Describes left upper abdominal pain in episodic fashion. Also deals with 'IBS' symptoms.   Last nuclear stress test in 2014 was stable without significant ischemia.   Outpatient Encounter Prescriptions as of 07/31/2013  Medication Sig  . aspirin 81 MG tablet Take 81 mg by mouth daily.  Marland Kitchen augmented betamethasone dipropionate (DIPROLENE-AF) 0.05 % cream Apply topically 2 (two) times daily.  . fluticasone (FLONASE) 50 MCG/ACT nasal spray Place 2 sprays into the nose daily.  Marland Kitchen ibuprofen (ADVIL,MOTRIN) 200 MG tablet Take 200 mg by mouth as needed for headache.  . olmesartan (BENICAR) 20 MG tablet Take 20 mg by mouth daily.  . pantoprazole (PROTONIX) 40 MG tablet   . sucralfate (CARAFATE) 1 G tablet Take 1 g by mouth 4 (four) times daily.  . tamsulosin (FLOMAX) 0.4 MG CAPS Take 0.4 mg by mouth daily.  . [DISCONTINUED] lansoprazole (PREVACID) 30 MG capsule Take 1 capsule (30 mg total) by mouth daily at 12 noon. Take lansoprazole by mouth 30 minutes before breakfast    Statins and Levofloxacin  Past Medical History  Diagnosis Date  . Hypertension   . BPH (benign prostatic hyperplasia)     Past Surgical History  Procedure Laterality Date  . Coronary angioplasty with stent placement      November 1998  . Cholecystectomy    . Appendectomy    . Vasectomy    . Esophagogastroduodenoscopy N/A 04/12/2013    Procedure:  ESOPHAGOGASTRODUODENOSCOPY (EGD);  Surgeon: Rogene Houston, MD;  Location: [facility];  Service: Endoscopy;  Laterality: N/A;  325-moved to 130 Melanie notified pt    History   Social History  . Marital Status: Married    Spouse Name: N/A    Number of Children: N/A  . Years of Education: N/A   Occupational History  . Not on file.   Social History Main Topics  . Smoking status: Former Smoker    Types: Cigarettes    Quit date: 05/15/1996  . Smokeless tobacco: Never Used     Comment: quit 08/1996  . Alcohol Use: 1.2 oz/week    2 Cans of beer per week     Comment: rare twice a week  . Drug Use: No  . Sexual Activity: Yes   Other Topics Concern  . Not on file   Social History Narrative  . No narrative on file    Family hx: Father had CABG at 87  ROS:  General: no fevers/chills/night sweats Eyes: no blurry vision, diplopia, or amaurosis ENT: no sore throat or hearing loss Resp: no cough, wheezing, or hemoptysis CV: no edema or palpitations GI: as per HPI. No hematemesis or hematochezia/melena GU: no dysuria, frequency, or hematuria Skin: no rash Neuro: no headache, numbness, tingling, or weakness of extremities Musculoskeletal: no joint pain or swelling Heme: no bleeding, DVT, or easy bruising Endo: no polydipsia or polyuria  BP 118/80  Pulse 62  Ht 6\' 2"  (1.88 m)  Wt 239 lb (108.41 kg)  BMI 30.67 kg/m2  PHYSICAL EXAM: Pt is alert and oriented, WD, WN, in no distress. HEENT: normal Neck: JVP normal. Carotid upstrokes normal without bruits. No thyromegaly. Lungs: equal expansion, clear bilaterally CV: Apex is discrete and nondisplaced, RRR without murmur or gallop Abd: soft, NT, +BS, no bruit, no hepatosplenomegaly Back: no CVA tenderness Ext: no C/C/E        DP/PT pulses intact and = Skin: warm and dry without rash Neuro: CNII-XII intact             Strength intact = bilaterally  EKG:  NSR 62 bpm, within normal limits  Myoview Scan  10/12/2012: Overall Impression: Abnormal nuclear perfusion imaging with little evidence of reversible perfusion defects consistent with significant ischemia. Low risk stress nuclear study.  Subdiaphragmatic tissue attenuation limits the interpretation of rest vs. Stress images.  Clinical correlation is warranted.   ASSESSMENT AND PLAN: 1. CAD, native vessel. Most recent cath and nuclear perfusion results reviewed. Pt is asymptomatic with no angina. He is on ASA 81 mg for antiplatelet Rx. With no hx of MI he does not need a beta-blocker. BP has been lower and his olmesartan dose has required reduction.  2. HTN - as above. Olmesartan reduced because of low BP's, now on 10 mg daily.  3. Hyperlipidemia - statin intolerant. States 'could barely walk' when taking statin. Reports most recent cholesterol less than 200. Managed by PCP.   For follow-up I'll see him back in one year unless problems arise in the interim.  Sherren Mocha 08/01/2013 11:26 PM

## 2013-10-10 ENCOUNTER — Other Ambulatory Visit: Payer: Self-pay | Admitting: *Deleted

## 2013-10-10 MED ORDER — PANTOPRAZOLE SODIUM 40 MG PO TBEC: 40.0000 mg | DELAYED_RELEASE_TABLET | ORAL | Status: DC | PRN

## 2013-10-10 NOTE — Telephone Encounter (Signed)
Rx refill sent to patients pharmacy  

## 2013-11-19 ENCOUNTER — Ambulatory Visit (INDEPENDENT_AMBULATORY_CARE_PROVIDER_SITE_OTHER): Payer: Federal, State, Local not specified - PPO | Admitting: Internal Medicine

## 2013-11-19 ENCOUNTER — Encounter (INDEPENDENT_AMBULATORY_CARE_PROVIDER_SITE_OTHER): Payer: Self-pay | Admitting: Internal Medicine

## 2013-11-19 VITALS — BP 118/80 | HR 82 | Temp 97.0°F | Resp 18 | Ht 74.0 in | Wt 243.5 lb

## 2013-11-19 DIAGNOSIS — R1012 Left upper quadrant pain: Secondary | ICD-10-CM

## 2013-11-19 DIAGNOSIS — K219 Gastro-esophageal reflux disease without esophagitis: Secondary | ICD-10-CM

## 2013-11-19 MED ORDER — SUCRALFATE 1 G PO TABS: 1.0000 g | ORAL_TABLET | Freq: Two times a day (BID) | ORAL | Status: DC

## 2013-11-19 NOTE — Progress Notes (Signed)
Presenting complaint;  Left upper quadrant abdominal pain and GERD.  Subjective:  Patient is 53 year old Caucasian male who is here for scheduled visit. He was seen 6 months ago. He says heartburn is well controlled with therapy. He is not having any side effects with PPI. He may have heartburn no more than once a week with certain foods. He denies dysphagia sore throat cough or hoarseness. He also denies nocturnal regurgitation. He remains with left upper quadrant pain which occurs almost daily. It is not as bad as it was 2 years ago. Pain seemed to get worse when he lies on that side or makes certain movements. He seemed to get relief when his bowels move. He has more pain if he skips his meal. He feels Carafate is helping him. He would like to get them prescription. His bowels move regularly. He denies melena or rectal bleeding. His last colonoscopy was in May 2010 in [place] revealing one small polyp which was hyperplastic.   Current Medications: Outpatient Encounter Prescriptions as of 11/19/2013  Medication Sig  . aspirin 81 MG tablet Take 81 mg by mouth daily.  Marland Kitchen augmented betamethasone dipropionate (DIPROLENE-AF) 0.05 % cream Apply topically 2 (two) times daily.  Marland Kitchen bismuth subsalicylate (PEPTO BISMOL) 262 MG/15ML suspension Take 30 mLs by mouth as needed.  . fluticasone (FLONASE) 50 MCG/ACT nasal spray Place 2 sprays into the nose daily.  Marland Kitchen ibuprofen (ADVIL,MOTRIN) 200 MG tablet Take 200 mg by mouth as needed for headache.  . olmesartan (BENICAR) 20 MG tablet Take 20 mg by mouth daily.  . sucralfate (CARAFATE) 1 G tablet Take 1 g by mouth. Patient is taking 1-2 a day  . tamsulosin (FLOMAX) 0.4 MG CAPS Take 0.4 mg by mouth daily.  . pantoprazole (PROTONIX) 40 MG tablet Take 1 tablet (40 mg total) by mouth as needed.     Objective: Blood pressure 118/80, pulse 82, temperature 97 F (36.1 C), temperature source Oral, resp. rate 18, height 6\' 2"  (1.88 m), weight 243 lb 8 oz (110.451  kg). Patient is alert and in no acute distress. Conjunctiva is pink. Sclera is nonicteric Oropharyngeal mucosa is normal. No neck masses or thyromegaly noted. Cardiac exam with regular rhythm normal S1 and S2. No murmur or gallop noted. Lungs are clear to auscultation. Abdomen is full. Bowel sounds are normal. No bruits noted. Abdomen is soft and nontender without organomegaly or masses.  No LE edema or clubbing noted.     Assessment:  #1. GERD. Patient is doing well with therapy. #2. Chronic left upper quadrant abdominal pain. He has had this pain for 2-3 years. Some features of this pain suggests musculoskeletal pain and others IBS. Since he has BPH he is not candidate for anti-spasmodic therapy.   Plan:  Trial with Aleve 20 mg by mouth daily for 2 weeks. New prescription given for sucralfate 1 g by mouth twice daily. Patient will call with progress report in two weeks. Will consider abdominopelvic CT if abdominal pain becomes more pronounced. Office visit in one year.

## 2013-11-19 NOTE — Patient Instructions (Signed)
Aleve 220 mg by mouth daily after breakfast daily for two weeks and call us with progress report. Notify if abdominal pain gets worse.

## 2014-05-21 ENCOUNTER — Other Ambulatory Visit (INDEPENDENT_AMBULATORY_CARE_PROVIDER_SITE_OTHER): Payer: Self-pay | Admitting: Internal Medicine

## 2014-07-31 ENCOUNTER — Encounter: Payer: Self-pay | Admitting: Cardiovascular Disease

## 2014-07-31 ENCOUNTER — Ambulatory Visit (INDEPENDENT_AMBULATORY_CARE_PROVIDER_SITE_OTHER): Payer: Federal, State, Local not specified - PPO | Admitting: Cardiovascular Disease

## 2014-07-31 VITALS — BP 122/72 | HR 63 | Ht 74.0 in | Wt 245.0 lb

## 2014-07-31 DIAGNOSIS — I2510 Atherosclerotic heart disease of native coronary artery without angina pectoris: Secondary | ICD-10-CM

## 2014-07-31 DIAGNOSIS — I10 Essential (primary) hypertension: Secondary | ICD-10-CM

## 2014-07-31 NOTE — Progress Notes (Signed)
Cardiology Office Note   Date:  07/31/2014   ID:  Sherman, Donaldson 02-21-1961, MRN [id number]  PCP:  Leonides Grills, MD  Cardiologist:  Sherren Mocha, MD    Chief Complaint  Patient presents with  . Coronary Artery Disease     History of Present Illness: [name] is a 54 y.o. male who presents for follow-up of coronary artery disease. He initially underwent stenting of the LAD in 1998. His most recent heart catheterization in 2010 demonstrated patency of his stent site with 30% in-stent restenosis and otherwise widely patent coronary arteries. The patient's left ventricular function has been normal.  The patient is doing well from a cardiac perspective. He denies chest pain, chest pressure, shortness of breath, edema, heart palpitations, orthopnea, or PND. He has no complaints today. He's had some GI symptoms and has been treated by Dr.Rehman. He's doing much better with this.   Past Medical History  Diagnosis Date  . Hypertension   . BPH (benign prostatic hyperplasia)     Past Surgical History  Procedure Laterality Date  . Coronary angioplasty with stent placement      November 1998  . Cholecystectomy    . Appendectomy    . Vasectomy    . Esophagogastroduodenoscopy N/A 04/12/2013    Procedure: ESOPHAGOGASTRODUODENOSCOPY (EGD);  Surgeon: Rogene Houston, MD;  Location: [facility];  Service: Endoscopy;  Laterality: N/A;  325-moved to 130 Melanie notified pt    Current Outpatient Prescriptions  Medication Sig Dispense Refill  . aspirin 81 MG tablet Take 81 mg by mouth daily.    Marland Kitchen augmented betamethasone dipropionate (DIPROLENE-AF) 0.05 % cream Apply topically 2 (two) times daily.    Marland Kitchen bismuth subsalicylate (PEPTO BISMOL) 262 MG/15ML suspension Take 30 mLs by mouth as needed.    . fluticasone (FLONASE) 50 MCG/ACT nasal spray Place 2 sprays into the nose daily.    . naproxen sodium (ANAPROX) 220 MG tablet Take 220 mg by mouth daily as needed.    Marland Kitchen olmesartan  (BENICAR) 20 MG tablet Take 20 mg by mouth daily.    . sucralfate (CARAFATE) 1 G tablet take 1 tablet by mouth twice a day 60 tablet 5  . tamsulosin (FLOMAX) 0.4 MG CAPS Take 0.4 mg by mouth daily.     No current facility-administered medications for this visit.    Allergies:   Statins and Levofloxacin   Social History:  The patient  reports that he quit smoking about 18 years ago. His smoking use included Cigarettes. He has never used smokeless tobacco. He reports that he drinks about 1.2 oz of alcohol per week. He reports that he does not use illicit drugs.   Family History:  The patient's family history is not on file.    ROS:  Please see the history of present illness.  Otherwise, review of systems is positive for abdominal pain.  All other systems are reviewed and negative.    PHYSICAL EXAM: VS:  BP 122/72 mmHg  Pulse 63  Ht 6\' 2"  (1.88 m)  Wt 245 lb (111.131 kg)  BMI 31.44 kg/m2 , BMI Body mass index is 31.44 kg/(m^2). GEN: Well nourished, well developed, in no acute distress HEENT: normal Neck: no JVD, carotid bruits, or masses Cardiac: RRR; no murmurs, rubs, or gallops,no edema  Respiratory:  clear to auscultation bilaterally, normal work of breathing GI: soft, nontender, nondistended, + BS MS: no deformity or atrophy Skin: warm and dry, no rash Neuro:  Strength and sensation  are intact Psych: euthymic mood, full affect  EKG:  EKG is ordered today. The ekg ordered today shows normal sinus rhythm 63 bpm, within normal limits.  Recent Labs: No results found for requested labs within last 365 days.   Lipid Panel     Component Value Date/Time   CHOL  12/04/2008 0711    129        ATP III CLASSIFICATION:  <200     mg/dL   Desirable  200-239  mg/dL   Borderline High  >=240    mg/dL   High          TRIG 68 12/04/2008 0711   HDL 27* 12/04/2008 0711   CHOLHDL 4.8 12/04/2008 0711   VLDL 14 12/04/2008 0711   LDLCALC  12/04/2008 0711    88        Total  Cholesterol/HDL:CHD Risk Coronary Heart Disease Risk Table                     Men   Women  1/2 Average Risk   3.4   3.3  Average Risk       5.0   4.4  2 X Average Risk   9.6   7.1  3 X Average Risk  23.4   11.0        Use the calculated Patient Ratio above and the CHD Risk Table to determine the patient's CHD Risk.        ATP III CLASSIFICATION (LDL):  <100     mg/dL   Optimal  100-129  mg/dL   Near or Above                    Optimal  130-159  mg/dL   Borderline  160-189  mg/dL   High  >190     mg/dL   Very High      Wt Readings from Last 3 Encounters:  07/31/14 245 lb (111.131 kg)  11/19/13 243 lb 8 oz (110.451 kg)  07/31/13 239 lb (108.41 kg)     ASSESSMENT AND PLAN: 1.  Coronary artery disease, native vessel. No anginal symptoms. Will continue aspirin. He is approaching 20 years out from his PCI procedure. His last heart catheterization in 2010 demonstrated patent coronary arteries and a patent stent with only mild in-stent restenosis. I've recommended an exercise treadmill study when he returns in one year.  2. Essential hypertension. Blood pressure well controlled. He takes one half of a Benicar daily and reports excellent home blood pressure readings.  3. Hyperlipidemia. The patient is statin intolerant with major weakness as a side effect. He is managed by his primary physician. He would like to avoid any medication because he finally has his GI symptoms straight now. I reviewed his last cholesterol panel and his total cholesterol is less than 200. His LDL was 121. We discussed efforts at diet and exercise.  Current medicines are reviewed with the patient today.  The patient does not have concerns regarding medicines.  The following changes have been made:  no change  Labs/ tests ordered today include: Exercise treadmill study in one year   No orders of the defined types were placed in this encounter.   Signed, Sherren Mocha, MD  07/31/2014 9:38 AM    [address], [place], [place]  [address] Phone: [phone]; Fax: [phone]

## 2014-07-31 NOTE — Patient Instructions (Signed)
Your physician recommends that you continue on your current medications as directed. Please refer to the Current Medication list given to you today.  Your physician has requested that you have an exercise tolerance test in 1 YEAR with Dr Burt Knack. For further information please visit [email or web address]. Please also follow instruction sheet, as given.

## 2014-08-15 ENCOUNTER — Encounter (INDEPENDENT_AMBULATORY_CARE_PROVIDER_SITE_OTHER): Payer: Self-pay | Admitting: *Deleted

## 2014-11-20 ENCOUNTER — Other Ambulatory Visit (INDEPENDENT_AMBULATORY_CARE_PROVIDER_SITE_OTHER): Payer: Self-pay | Admitting: Internal Medicine

## 2014-11-20 ENCOUNTER — Ambulatory Visit (INDEPENDENT_AMBULATORY_CARE_PROVIDER_SITE_OTHER): Payer: Federal, State, Local not specified - PPO | Admitting: Internal Medicine

## 2014-11-20 ENCOUNTER — Encounter (INDEPENDENT_AMBULATORY_CARE_PROVIDER_SITE_OTHER): Payer: Self-pay | Admitting: Internal Medicine

## 2014-11-20 VITALS — BP 132/78 | HR 72 | Temp 98.0°F | Ht 74.0 in | Wt 248.6 lb

## 2014-11-20 DIAGNOSIS — K219 Gastro-esophageal reflux disease without esophagitis: Secondary | ICD-10-CM | POA: Diagnosis not present

## 2014-11-20 NOTE — Progress Notes (Signed)
   Subjective:    Patient ID: [name], male    DOB: [birth year], 54 y.o.   MRN: [id number]  HPI Here today for f/u of his GERD. He was last seen by Dr. Laural Golden in May of 2015. Hx of GERD. He is not taking anything for his acid reflux. He says he has lost weight and feels much better. He is watching what he eats. He is sore in his legs from laying a floor over the weekend. He takes the Carafate occasionally on his rt upper abdomen which helops.  No hoarseness.  No dysphagia.  Appetite is good. He has gained 5 pounds since his last visit. He usually has a BM once a day and twice a day.  No melena or BRRB  04/12/2013 EGD: left upper quadrant pain, GERD;  Impression: Focal erythema distal esophagus suggestive of healing erosions. Few small hyperplastic polyps in proximal stomach. These were left alone. Pyloric channel inflammation without ulcer or stenosis.    His last colonoscopy was in May 2010 in [place] revealing one small polyp which was hyperplastic.  Review of Systems Married, two children in good health. Works at [employer].     Past Medical History  Diagnosis Date  . Hypertension   . BPH (benign prostatic hyperplasia)     Past Surgical History  Procedure Laterality Date  . Coronary angioplasty with stent placement      November 1998  . Cholecystectomy    . Appendectomy    . Vasectomy    . Esophagogastroduodenoscopy N/A 04/12/2013    Procedure: ESOPHAGOGASTRODUODENOSCOPY (EGD);  Surgeon: Rogene Houston, MD;  [facility];  Service: Endoscopy;  Laterality: N/A;  325-moved to 130 Melanie notified pt    Allergies  Allergen Reactions  . Statins Other (See Comments)    Muscles hurt.  . Levofloxacin Palpitations    Current Outpatient Prescriptions on File Prior to Visit  Medication Sig Dispense Refill  . aspirin 81 MG tablet Take 81 mg by mouth daily.    Marland Kitchen augmented betamethasone dipropionate (DIPROLENE-AF) 0.05 % cream Apply topically 2 (two) times  daily.    Marland Kitchen bismuth subsalicylate (PEPTO BISMOL) 262 MG/15ML suspension Take 30 mLs by mouth as needed.    . fluticasone (FLONASE) 50 MCG/ACT nasal spray Place 2 sprays into the nose daily.    Marland Kitchen olmesartan (BENICAR) 20 MG tablet Take 10 mg by mouth daily.     . sucralfate (CARAFATE) 1 G tablet take 1 tablet by mouth twice a day 60 tablet 5  . tamsulosin (FLOMAX) 0.4 MG CAPS Take 0.4 mg by mouth daily.     No current facility-administered medications on file prior to visit.     Objective:   Physical Exam Blood pressure 132/78, pulse 72, temperature 98 F (36.7 C), height 6\' 2"  (1.88 m), weight 248 lb 9.6 oz (112.764 kg).  Alert and oriented. Skin warm and dry. Oral mucosa is moist.   . Sclera anicteric, conjunctivae is pink. Thyroid not enlarged. No cervical lymphadenopathy. Lungs clear. Heart regular rate and rhythm.  Abdomen is soft. Bowel sounds are positive. No hepatomegaly. No abdominal masses felt. No tenderness.  No edema to lower extremities. Patient is alert and oriented.        Assessment & Plan:  GERD. Controlled at this time with weight loss and diet. Uses Carafate maybe once a day. No left upper quadrant pain today. Use Aleve as needed.

## 2014-11-20 NOTE — Patient Instructions (Signed)
Continue the Carafate daily. OV in one year.

## 2014-12-13 ENCOUNTER — Ambulatory Visit (INDEPENDENT_AMBULATORY_CARE_PROVIDER_SITE_OTHER): Payer: Federal, State, Local not specified - PPO | Admitting: Urology

## 2014-12-13 DIAGNOSIS — N5201 Erectile dysfunction due to arterial insufficiency: Secondary | ICD-10-CM | POA: Diagnosis not present

## 2014-12-13 DIAGNOSIS — R312 Other microscopic hematuria: Secondary | ICD-10-CM | POA: Diagnosis not present

## 2015-01-20 ENCOUNTER — Encounter: Payer: Self-pay | Admitting: *Deleted

## 2015-02-21 ENCOUNTER — Ambulatory Visit (INDEPENDENT_AMBULATORY_CARE_PROVIDER_SITE_OTHER): Payer: Federal, State, Local not specified - PPO | Admitting: Urology

## 2015-02-21 DIAGNOSIS — N5201 Erectile dysfunction due to arterial insufficiency: Secondary | ICD-10-CM

## 2015-03-07 ENCOUNTER — Ambulatory Visit (INDEPENDENT_AMBULATORY_CARE_PROVIDER_SITE_OTHER): Payer: Federal, State, Local not specified - PPO | Admitting: Urology

## 2015-03-07 DIAGNOSIS — N5201 Erectile dysfunction due to arterial insufficiency: Secondary | ICD-10-CM

## 2015-06-06 ENCOUNTER — Ambulatory Visit (INDEPENDENT_AMBULATORY_CARE_PROVIDER_SITE_OTHER): Payer: Federal, State, Local not specified - PPO | Admitting: Urology

## 2015-06-06 DIAGNOSIS — N5201 Erectile dysfunction due to arterial insufficiency: Secondary | ICD-10-CM

## 2015-07-01 ENCOUNTER — Other Ambulatory Visit (INDEPENDENT_AMBULATORY_CARE_PROVIDER_SITE_OTHER): Payer: Self-pay | Admitting: Internal Medicine

## 2015-07-31 ENCOUNTER — Telehealth (HOSPITAL_COMMUNITY): Payer: Self-pay

## 2015-07-31 NOTE — Telephone Encounter (Signed)
Encounter complete. 

## 2015-08-01 ENCOUNTER — Ambulatory Visit (HOSPITAL_COMMUNITY)
Admission: RE | Admit: 2015-08-01 | Discharge: 2015-08-01 | Disposition: A | Discharge status: Home / Self Care | Payer: Federal, State, Local not specified - PPO | Source: Ambulatory Visit | Attending: Urology | Admitting: Urology

## 2015-08-01 ENCOUNTER — Encounter (INDEPENDENT_AMBULATORY_CARE_PROVIDER_SITE_OTHER): Payer: Self-pay | Admitting: Internal Medicine

## 2015-08-01 ENCOUNTER — Other Ambulatory Visit: Payer: Self-pay | Admitting: Cardiovascular Disease

## 2015-08-01 DIAGNOSIS — I10 Essential (primary) hypertension: Secondary | ICD-10-CM

## 2015-08-01 DIAGNOSIS — R9439 Abnormal result of other cardiovascular function study: Secondary | ICD-10-CM | POA: Diagnosis not present

## 2015-08-01 LAB — EXERCISE TOLERANCE TEST
CHL CUP RESTING HR STRESS: 65 {beats}/min
CHL RATE OF PERCEIVED EXERTION: 17
CSEPED: 10 min
Estimated workload: 12.8 METS
Exercise duration (sec): 42 s
MPHR: 163 {beats}/min
Peak HR: 162 {beats}/min
Percent HR: 99 %

## 2015-12-03 ENCOUNTER — Encounter (INDEPENDENT_AMBULATORY_CARE_PROVIDER_SITE_OTHER): Payer: Self-pay | Admitting: Internal Medicine

## 2015-12-03 ENCOUNTER — Ambulatory Visit (INDEPENDENT_AMBULATORY_CARE_PROVIDER_SITE_OTHER): Payer: Federal, State, Local not specified - PPO | Admitting: Internal Medicine

## 2015-12-03 VITALS — BP 150/70 | HR 64 | Temp 97.5°F | Ht 73.5 in | Wt 244.9 lb

## 2015-12-03 DIAGNOSIS — K219 Gastro-esophageal reflux disease without esophagitis: Secondary | ICD-10-CM

## 2015-12-03 DIAGNOSIS — I10 Essential (primary) hypertension: Secondary | ICD-10-CM | POA: Diagnosis not present

## 2015-12-03 DIAGNOSIS — Z0000 Encounter for general adult medical examination without abnormal findings: Secondary | ICD-10-CM | POA: Diagnosis not present

## 2015-12-03 DIAGNOSIS — Z1159 Encounter for screening for other viral diseases: Secondary | ICD-10-CM | POA: Diagnosis not present

## 2015-12-03 DIAGNOSIS — Z23 Encounter for immunization: Secondary | ICD-10-CM | POA: Diagnosis not present

## 2015-12-03 NOTE — Patient Instructions (Signed)
OV in 1 year.  

## 2015-12-03 NOTE — Progress Notes (Addendum)
   Subjective:    Patient ID: [name], male    DOB: Mar 20, 1961, 55 y.o.   MRN: [id number]  HPIHere today for f/u of his chronic GERD. He was last seen in May of 2016. Weight in May 2016 was 248. He tells me he is doing well. He hasn't had to take a Prilosec in over a year. His appetite is good. He rarely fries foods. Most meats are grilled or baked. He avoids spicy foods.  He pretty much stays away from [place]. BMs x 1-2 a day.  No family hx of colon cancer.    HPI Here today for f/u of his GERD. He was last seen by Dr. Darra Lis s a BM once a day and twice a day. No melena or BRRB 04/12/2013 EGD: left upper quadrant pain, GERD;  Impression: Focal erythema distal esophagus suggestive of healing erosions. Few small hyperplastic polyps in proximal stomach. These were left alone. Pyloric channel inflammation without ulcer or stenosis.   His last colonoscopy was in May 2010 in [place] revealing one small polyp which was hyperplastic.  Review of Systems Past Medical History  Diagnosis Date  . Hypertension   . BPH (benign prostatic hyperplasia)     Past Surgical History  Procedure Laterality Date  . Coronary angioplasty with stent placement      November 1998  . Cholecystectomy    . Appendectomy    . Vasectomy    . Esophagogastroduodenoscopy N/A 04/12/2013    Procedure: ESOPHAGOGASTRODUODENOSCOPY (EGD);  Surgeon: Rogene Houston, MD;  [facility];  Service: Endoscopy;  Laterality: N/A;  325-moved to 130 Melanie notified pt    Allergies  Allergen Reactions  . Statins Other (See Comments)    Muscles hurt.  . Levofloxacin Palpitations    Current Outpatient Prescriptions on File Prior to Visit  Medication Sig Dispense Refill  . aspirin 81 MG tablet Take 81 mg by mouth daily.    Marland Kitchen augmented betamethasone dipropionate (DIPROLENE-AF) 0.05 % cream Apply topically 2 (two) times daily.    Marland Kitchen olmesartan (BENICAR) 20 MG tablet Take 10 mg by mouth daily.     .  sucralfate (CARAFATE) 1 G tablet take 1 tablet twice a day 60 tablet 5  . tamsulosin (FLOMAX) 0.4 MG CAPS Take 0.4 mg by mouth daily.     No current facility-administered medications on file prior to visit.        Objective:   Physical Exam Blood pressure 150/70, pulse 64, temperature 97.5 F (36.4 C), height 6' 1.5" (1.867 m), weight 244 lb 14.4 oz (111.086 kg).  Alert and oriented. Skin warm and dry. Oral mucosa is moist.   . Sclera anicteric, conjunctivae is pink. Thyroid not enlarged. No cervical lymphadenopathy. Lungs clear. Heart regular rate and rhythm.  Abdomen is soft. Bowel sounds are positive. No hepatomegaly. No abdominal masses felt. No tenderness.  No edema to lower extremities.         Assessment & Plan:  GERD. Rarely has acid reflux.   OV in 1 year.

## 2015-12-17 DIAGNOSIS — L821 Other seborrheic keratosis: Secondary | ICD-10-CM | POA: Diagnosis not present

## 2015-12-17 DIAGNOSIS — L818 Other specified disorders of pigmentation: Secondary | ICD-10-CM | POA: Diagnosis not present

## 2016-01-08 ENCOUNTER — Other Ambulatory Visit (INDEPENDENT_AMBULATORY_CARE_PROVIDER_SITE_OTHER): Payer: Self-pay | Admitting: Internal Medicine

## 2016-06-11 ENCOUNTER — Ambulatory Visit (INDEPENDENT_AMBULATORY_CARE_PROVIDER_SITE_OTHER): Payer: Federal, State, Local not specified - PPO | Admitting: Urology

## 2016-06-11 DIAGNOSIS — N401 Enlarged prostate with lower urinary tract symptoms: Secondary | ICD-10-CM | POA: Diagnosis not present

## 2016-06-11 DIAGNOSIS — R351 Nocturia: Secondary | ICD-10-CM | POA: Diagnosis not present

## 2016-06-11 DIAGNOSIS — N5201 Erectile dysfunction due to arterial insufficiency: Secondary | ICD-10-CM | POA: Diagnosis not present

## 2016-07-06 ENCOUNTER — Other Ambulatory Visit (INDEPENDENT_AMBULATORY_CARE_PROVIDER_SITE_OTHER): Payer: Self-pay | Admitting: Internal Medicine

## 2016-07-10 DIAGNOSIS — Z79899 Other long term (current) drug therapy: Secondary | ICD-10-CM | POA: Diagnosis not present

## 2016-07-10 DIAGNOSIS — R1012 Left upper quadrant pain: Secondary | ICD-10-CM | POA: Diagnosis not present

## 2016-07-10 DIAGNOSIS — N201 Calculus of ureter: Secondary | ICD-10-CM | POA: Diagnosis not present

## 2016-07-10 DIAGNOSIS — R1013 Epigastric pain: Secondary | ICD-10-CM | POA: Diagnosis not present

## 2016-07-10 DIAGNOSIS — I2510 Atherosclerotic heart disease of native coronary artery without angina pectoris: Secondary | ICD-10-CM | POA: Diagnosis not present

## 2016-07-10 DIAGNOSIS — R202 Paresthesia of skin: Secondary | ICD-10-CM | POA: Diagnosis not present

## 2016-07-10 DIAGNOSIS — Z888 Allergy status to other drugs, medicaments and biological substances status: Secondary | ICD-10-CM | POA: Diagnosis not present

## 2016-07-10 DIAGNOSIS — R079 Chest pain, unspecified: Secondary | ICD-10-CM | POA: Diagnosis not present

## 2016-07-10 DIAGNOSIS — N400 Enlarged prostate without lower urinary tract symptoms: Secondary | ICD-10-CM | POA: Diagnosis not present

## 2016-07-10 DIAGNOSIS — R110 Nausea: Secondary | ICD-10-CM | POA: Diagnosis not present

## 2016-07-10 DIAGNOSIS — R200 Anesthesia of skin: Secondary | ICD-10-CM | POA: Diagnosis not present

## 2016-07-10 DIAGNOSIS — Z87891 Personal history of nicotine dependence: Secondary | ICD-10-CM | POA: Diagnosis not present

## 2016-07-10 DIAGNOSIS — I10 Essential (primary) hypertension: Secondary | ICD-10-CM | POA: Diagnosis not present

## 2016-07-10 DIAGNOSIS — K219 Gastro-esophageal reflux disease without esophagitis: Secondary | ICD-10-CM | POA: Diagnosis not present

## 2016-07-10 DIAGNOSIS — Z955 Presence of coronary angioplasty implant and graft: Secondary | ICD-10-CM | POA: Diagnosis not present

## 2016-07-10 DIAGNOSIS — Z7982 Long term (current) use of aspirin: Secondary | ICD-10-CM | POA: Diagnosis not present

## 2016-07-10 DIAGNOSIS — R9431 Abnormal electrocardiogram [ECG] [EKG]: Secondary | ICD-10-CM | POA: Diagnosis not present

## 2016-07-10 DIAGNOSIS — E7800 Pure hypercholesterolemia, unspecified: Secondary | ICD-10-CM | POA: Diagnosis not present

## 2016-07-10 DIAGNOSIS — Z9049 Acquired absence of other specified parts of digestive tract: Secondary | ICD-10-CM | POA: Diagnosis not present

## 2016-07-11 DIAGNOSIS — E7800 Pure hypercholesterolemia, unspecified: Secondary | ICD-10-CM | POA: Diagnosis not present

## 2016-07-11 DIAGNOSIS — R202 Paresthesia of skin: Secondary | ICD-10-CM | POA: Diagnosis not present

## 2016-07-11 DIAGNOSIS — I2510 Atherosclerotic heart disease of native coronary artery without angina pectoris: Secondary | ICD-10-CM | POA: Diagnosis not present

## 2016-07-23 DIAGNOSIS — R202 Paresthesia of skin: Secondary | ICD-10-CM | POA: Diagnosis not present

## 2016-07-23 DIAGNOSIS — I10 Essential (primary) hypertension: Secondary | ICD-10-CM | POA: Diagnosis not present

## 2016-07-23 DIAGNOSIS — R208 Other disturbances of skin sensation: Secondary | ICD-10-CM | POA: Diagnosis not present

## 2016-07-23 DIAGNOSIS — I2510 Atherosclerotic heart disease of native coronary artery without angina pectoris: Secondary | ICD-10-CM | POA: Diagnosis not present

## 2016-07-29 DIAGNOSIS — R202 Paresthesia of skin: Secondary | ICD-10-CM | POA: Diagnosis not present

## 2016-08-27 ENCOUNTER — Encounter (INDEPENDENT_AMBULATORY_CARE_PROVIDER_SITE_OTHER): Payer: Self-pay

## 2016-08-27 ENCOUNTER — Ambulatory Visit (INDEPENDENT_AMBULATORY_CARE_PROVIDER_SITE_OTHER): Payer: Federal, State, Local not specified - PPO | Admitting: Cardiovascular Disease

## 2016-08-27 ENCOUNTER — Encounter: Payer: Self-pay | Admitting: Cardiovascular Disease

## 2016-08-27 VITALS — BP 110/68 | HR 58 | Ht 74.0 in | Wt 239.6 lb

## 2016-08-27 DIAGNOSIS — I10 Essential (primary) hypertension: Secondary | ICD-10-CM | POA: Diagnosis not present

## 2016-08-27 DIAGNOSIS — I2510 Atherosclerotic heart disease of native coronary artery without angina pectoris: Secondary | ICD-10-CM | POA: Diagnosis not present

## 2016-08-27 DIAGNOSIS — E785 Hyperlipidemia, unspecified: Secondary | ICD-10-CM

## 2016-08-27 MED ORDER — EZETIMIBE 10 MG PO TABS: 10.0000 mg | ORAL_TABLET | Freq: Every day | ORAL | 3 refills | Status: DC

## 2016-08-27 NOTE — Patient Instructions (Signed)
Medication Instructions:  Your physician has recommended you make the following change in your medication:  1. START Zetia 10mg  take one tablet by mouth daily  Labwork: Your physician recommends that you return for a FASTING LIPID and LIVER in 3 MONTHS--nothing to eat or drink after midnight, lab opens at 7:30 AM  Testing/Procedures: No new orders.  Follow-Up: Your physician has requested that you have an exercise tolerance test in 1 YEAR. For further information please visit [email or web address]. Please also follow instruction sheet, as given.  Your physician wants you to follow-up in: 1 YEAR with Dr Burt Knack (after stress test has been performed).  You will receive a reminder letter in the mail two months in advance. If you don't receive a letter, please call our office to schedule the follow-up appointment.   Any Other Special Instructions Will Be Listed Below (If Applicable).     If you need a refill on your cardiac medications before your next appointment, please call your pharmacy.

## 2016-08-27 NOTE — Progress Notes (Signed)
Cardiology Office Note Date:  08/27/2016   ID:  [name] 01/09/1961, MRN [id number]  PCP:  Vicenta Aly, FNP  Cardiologist:  Sherren Mocha, MD    Chief Complaint  Patient presents with  . Atherosclerosis of native coronary artery    follow up     History of Present Illness: [name] is a 56 y.o. male who presents for  follow-up of coronary artery disease. He initially underwent stenting of the LAD in 1998. His most recent heart catheterization in 2010 demonstrated patency of his stent site with 30% in-stent restenosis and otherwise widely patent coronary arteries. The patient's left ventricular function has been normal. His last stress test one year ago demonstrating good exercise capacity with upsloping ST depression and a benign pattern without anginal symptoms.  The patient is here with his wife today. He had an episode in January where he was rushing around and he developed numbness and tingling on his right side as well as his right face. He was hospitalized overnight with a negative CT scan, but was felt to likely have sustained a TIA. He's had no recurrent symptoms. He denies chest pain, chest pressure, or shortness of breath with activity. He walks over a mile daily for exercise. He has no exertional symptoms on a normal basis.   Past Medical History:  Diagnosis Date  . BPH (benign prostatic hyperplasia)   . Hypertension     Past Surgical History:  Procedure Laterality Date  . APPENDECTOMY    . CHOLECYSTECTOMY    . CORONARY ANGIOPLASTY WITH STENT PLACEMENT     November 1998  . ESOPHAGOGASTRODUODENOSCOPY N/A 04/12/2013   Procedure: ESOPHAGOGASTRODUODENOSCOPY (EGD);  Surgeon: Rogene Houston, MD;  [facility];  Service: Endoscopy;  Laterality: N/A;  325-moved to 130 Melanie notified pt  . VASECTOMY      Current Outpatient Prescriptions  Medication Sig Dispense Refill  . alprostadil (EDEX) 20 MCG injection 20 mcg by Intracavitary route as  directed. use no more than 3 times per week    . aspirin 81 MG tablet Take 81 mg by mouth daily.    Marland Kitchen augmented betamethasone dipropionate (DIPROLENE-AF) 0.05 % cream Apply 1 application topically 2 (two) times daily.     Marland Kitchen azelastine (ASTELIN) 0.1 % nasal spray Place into both nostrils 2 (two) times daily. Use in each nostril as directed    . montelukast (SINGULAIR) 10 MG tablet Take 10 mg by mouth at bedtime.    . naproxen sodium (ANAPROX) 220 MG tablet Take 220 mg by mouth daily as needed (pain).     Marland Kitchen olmesartan (BENICAR) 20 MG tablet Take 10 mg by mouth daily.     . sucralfate (CARAFATE) 1 g tablet take 1 tablet by mouth twice a day 60 tablet 5  . tamsulosin (FLOMAX) 0.4 MG CAPS Take 0.4 mg by mouth daily.    Marland Kitchen ezetimibe (ZETIA) 10 MG tablet Take 1 tablet (10 mg total) by mouth daily. 90 tablet 3   No current facility-administered medications for this visit.     Allergies:   Levofloxacin; Statins; and Levofloxacin   Social History:  The patient  reports that he quit smoking about 20 years ago. His smoking use included Cigarettes. He has never used smokeless tobacco. He reports that he drinks about 1.2 oz of alcohol per week . He reports that he does not use drugs.   Family History:  The patient's  family history includes Aneurysm in his mother; Cancer in  his maternal grandfather and maternal grandmother; Epilepsy in his mother; Heart disease in his father; Hypertension in his mother.    ROS:  Please see the history of present illness.  All other systems are reviewed and negative.    PHYSICAL EXAM: VS:  BP 110/68   Pulse (!) 58   Ht 6\' 2"  (1.88 m)   Wt 239 lb 9.6 oz (108.7 kg)   BMI 30.76 kg/m  , BMI Body mass index is 30.76 kg/m. GEN: Well nourished, well developed, in no acute distress  HEENT: normal  Neck: no JVD, no masses. No carotid bruits Cardiac: RRR without murmur or gallop                Respiratory:  clear to auscultation bilaterally, normal work of breathing GI:  soft, nontender, nondistended, + BS MS: no deformity or atrophy  Ext: no pretibial edema, pedal pulses 2+= bilaterally Skin: warm and dry, no rash Neuro:  Strength and sensation are intact Psych: euthymic mood, full affect  EKG:  EKG is ordered today. The ekg ordered today shows NSR 59 bpm within normal limits  Recent Labs: No results found for requested labs within last 8760 hours.   Lipid Panel     Component Value Date/Time   CHOL  12/04/2008 0711    129        ATP III CLASSIFICATION:  <200     mg/dL   Desirable  200-239  mg/dL   Borderline High  >=240    mg/dL   High          TRIG 68 12/04/2008 0711   HDL 27 (L) 12/04/2008 0711   CHOLHDL 4.8 12/04/2008 0711   VLDL 14 12/04/2008 0711   LDLCALC  12/04/2008 0711    88        Total Cholesterol/HDL:CHD Risk Coronary Heart Disease Risk Table                     Men   Women  1/2 Average Risk   3.4   3.3  Average Risk       5.0   4.4  2 X Average Risk   9.6   7.1  3 X Average Risk  23.4   11.0        Use the calculated Patient Ratio above and the CHD Risk Table to determine the patient's CHD Risk.        ATP III CLASSIFICATION (LDL):  <100     mg/dL   Optimal  100-129  mg/dL   Near or Above                    Optimal  130-159  mg/dL   Borderline  160-189  mg/dL   High  >190     mg/dL   Very High      Wt Readings from Last 3 Encounters:  08/27/16 239 lb 9.6 oz (108.7 kg)  12/03/15 244 lb 14.4 oz (111.1 kg)  11/20/14 248 lb 9.6 oz (112.8 kg)     Cardiac Studies Reviewed: Study Highlights    Upsloping ST segment depression ST segment depression was noted during stress in the II, III, aVF, V4, V5 and V6 leads, and returning to baseline after less than 1 minute of recovery.  Blood pressure demonstrated a hypertensive response to exercise.   Stress Measurements   Baseline Vitals  Rest HR 65 bpm    Rest BP 126/83 mmHg    Exercise Time  Exercise duration (min) 10 min    Exercise duration (sec) 42 sec      Peak Stress Vitals  Peak HR 162 bpm    Peak BP 164/81 mmHg    Exercise Data  MPHR 163 bpm    Percent HR 99 %    RPE 17     Estimated workload 12.8 METS       Carotid Duplex: IMPRESSION: 1.No hemodynamically significant stenosis on either side. 2.Both vertebral arteries are patent with antegrade flow.  ASSESSMENT AND PLAN: 1.  Coronary artery disease, native vessel, without symptoms of angina: Medical program is reviewed and will be continued. See below for discussion of cholesterol. Follow-up exercise treadmill study next year.  2. Hypertension: Blood pressure is well controlled on Benicar.  3. Hyperlipidemia: Last lipid panel shows an LDL cholesterol of 124 mg/dL. The patient is statin intolerant. He has tried multiple agents at low dose. He cannot tolerate statins because of myalgias. He has not tried any non-statin drugs. Will give him a trial of zetia 10 mg daily. Repeat lipids and LFTs in 3 months. If he is above goal will consider referral to the lipid clinic for consideration of a PCSK9 inhibitor. Diet and lifestyle modification reviewed at length with the patient as well.  Current medicines are reviewed with the patient today.  The patient does not have concerns regarding medicines.  Labs/ tests ordered today include:   Orders Placed This Encounter  Procedures  . Lipid panel  . Hepatic function panel  . Exercise Tolerance Test  . EKG 12-Lead    Disposition:   FU one year with a GXT  Signed, Sherren Mocha, MD  08/27/2016 1:41 PM    [facility] [place], [place], [place]  [address] Phone: [phone]; Fax: [phone]

## 2016-10-13 ENCOUNTER — Ambulatory Visit: Payer: Federal, State, Local not specified - PPO | Admitting: Cardiovascular Disease

## 2016-11-22 ENCOUNTER — Encounter (INDEPENDENT_AMBULATORY_CARE_PROVIDER_SITE_OTHER): Payer: Self-pay

## 2016-11-22 ENCOUNTER — Other Ambulatory Visit: Payer: Federal, State, Local not specified - PPO

## 2016-11-22 DIAGNOSIS — I10 Essential (primary) hypertension: Secondary | ICD-10-CM

## 2016-11-22 DIAGNOSIS — E785 Hyperlipidemia, unspecified: Secondary | ICD-10-CM | POA: Diagnosis not present

## 2016-11-22 LAB — LIPID PANEL
CHOL/HDL RATIO: 4.7 ratio (ref 0.0–5.0)
Cholesterol, Total: 160 mg/dL (ref 100–199)
HDL: 34 mg/dL — ABNORMAL LOW (ref >39)
LDL CALC: 102 mg/dL — AB (ref 0–99)
Triglycerides: 121 mg/dL (ref 0–149)
VLDL Cholesterol Cal: 24 mg/dL (ref 5–40)

## 2016-11-22 LAB — HEPATIC FUNCTION PANEL
ALBUMIN: 4.1 g/dL (ref 3.5–5.5)
ALT: 14 IU/L (ref 0–44)
AST: 14 IU/L (ref 0–40)
Alkaline Phosphatase: 61 IU/L (ref 39–117)
BILIRUBIN TOTAL: 0.8 mg/dL (ref 0.0–1.2)
Bilirubin, Direct: 0.18 mg/dL (ref 0.00–0.40)
Total Protein: 6.1 g/dL (ref 6.0–8.5)

## 2016-12-13 ENCOUNTER — Other Ambulatory Visit (INDEPENDENT_AMBULATORY_CARE_PROVIDER_SITE_OTHER): Payer: Self-pay | Admitting: Internal Medicine

## 2016-12-27 ENCOUNTER — Ambulatory Visit (INDEPENDENT_AMBULATORY_CARE_PROVIDER_SITE_OTHER): Payer: Federal, State, Local not specified - PPO | Admitting: Pharmacist

## 2016-12-27 DIAGNOSIS — E785 Hyperlipidemia, unspecified: Secondary | ICD-10-CM

## 2016-12-27 NOTE — Progress Notes (Signed)
Patient ID: [name]                 DOB: 16-Feb-1961                    MRN: [id number]     HPI: LIPA KNAUFF is a 56 y.o. male patient of Dr. Burt Knack that presents today for lipid evaluation. PMH includes CAD s/p stent to LAD in 1998, HTN, possible TIA. He was recently started on zetia 10mg  daily.   He presents today with his wife. He states he has had a very difficult time tolerating statin medications due to muscle aching. He states the aching is so bad he can barely move. This resolves a few days after stopping the medications. He also report he recently tried zetia but was unable to tolerate due to GI discomfort.   He currently uses [employer] as his Psychologist, prison and probation services.    Risk Factors: CAD s/p stent in LAD in 1998 LDL Goal: <70  Current Medications: none  Intolerances: zetia - GI discomfort increasing over 3 weeks of therapy - has improved since discontinuation; Lipitor 20mg  daily, Crestor 5mg  daily, Pravastatin, Zocor, Livalo 1mg  daily - muscles and joints hurt so bad he is unable to move.   Diet: Combination of out and at home. Does eat some red meat. Grilled and baked from home, does endorse some fried foods. Does eat vegetables. When eat out will eat baked potatoes. Eats whole wheat bread at home. Drinks 2 bottles cokes per day, otherwise water.   Exercise: Was going to gym and walking 1-2 miles 2-3 times per weeks, but hasn't been in a month or so due to being busy. He tries to stay active.   Family History: Aneurysm in his mother; Cancer in his maternal grandfather and maternal grandmother; Epilepsy in his mother; Heart disease in his father; Hypertension in his mother.   Social History: The patient  reports that he quit smoking about 20 years ago. His smoking use included Cigarettes. He has never used smokeless tobacco. He reports that he drinks about 1.2 oz of alcohol per week . He reports that he does not use drugs.   Labs: 11/22/16:  TC 160, TG 121, HDL 34, LDL  102 - zetia 10mg  daily   Past Medical History:  Diagnosis Date  . BPH (benign prostatic hyperplasia)   . Hypertension     Current Outpatient Prescriptions on File Prior to Visit  Medication Sig Dispense Refill  . alprostadil (EDEX) 20 MCG injection 20 mcg by Intracavitary route as directed. use no more than 3 times per week    . aspirin 81 MG tablet Take 81 mg by mouth daily.    Marland Kitchen augmented betamethasone dipropionate (DIPROLENE-AF) 0.05 % cream Apply 1 application topically 2 (two) times daily.     Marland Kitchen azelastine (ASTELIN) 0.1 % nasal spray Place into both nostrils 2 (two) times daily. Use in each nostril as directed    . ezetimibe (ZETIA) 10 MG tablet Take 1 tablet (10 mg total) by mouth daily. 90 tablet 3  . montelukast (SINGULAIR) 10 MG tablet Take 10 mg by mouth at bedtime.    . naproxen sodium (ANAPROX) 220 MG tablet Take 220 mg by mouth daily as needed (pain).     Marland Kitchen olmesartan (BENICAR) 20 MG tablet Take 10 mg by mouth daily.     . sucralfate (CARAFATE) 1 g tablet take 1 tablet twice a day 60 tablet 5  . tamsulosin (FLOMAX)  0.4 MG CAPS Take 0.4 mg by mouth daily.     No current facility-administered medications on file prior to visit.     Allergies  Allergen Reactions  . Levofloxacin Palpitations  . Statins Other (See Comments)    Muscles hurt. Muscles hurt.  . Levofloxacin Palpitations    Assessment/Plan: Hyperlipidemia: LDL not at goal <70. He will remain off Zetia due to GI issues. PCSK9i therapy is only option to help bring him to goal. He is aware of injectable therapy and is comfortable with this. He was provided copay card for Praluent therapy today.  Thank you,  Lelan Pons. Patterson Hammersmith, [facility]  12/27/2016 7:27 AM

## 2016-12-27 NOTE — Patient Instructions (Signed)
We will submit for coverage of Praluent injection. We will call you once we receive a determination from insurance.    Cholesterol Cholesterol is a fat. Your body needs a small amount of cholesterol. Cholesterol (plaque) may build up in your blood vessels (arteries). That makes you more likely to have a heart attack or stroke. You cannot feel your cholesterol level. Having a blood test is the only way to find out if your level is high. Keep your test results. Work with your doctor to keep your cholesterol at a good level. What do the results mean?  Total cholesterol is how much cholesterol is in your blood.  LDL is bad cholesterol. This is the type that can build up. Try to have low LDL.  HDL is good cholesterol. It cleans your blood vessels and carries LDL away. Try to have high HDL.  Triglycerides are fat that the body can store or burn for energy. What are good levels of cholesterol?  Total cholesterol below 200.  LDL below 100 is good for people who have health risks. LDL below 70 is good for people who have very high risks.  HDL above 40 is good. It is best to have HDL of 60 or higher.  Triglycerides below 150. How can I lower my cholesterol? Diet Follow your diet program as told by your doctor.  Choose fish, white meat chicken, or [place] that is roasted or baked. Try not to eat red meat, fried foods, sausage, or lunch meats.  Eat lots of fresh fruits and vegetables.  Choose whole grains, beans, pasta, potatoes, and cereals.  Choose olive oil, corn oil, or canola oil. Only use small amounts.  Try not to eat butter, mayonnaise, shortening, or palm kernel oils.  Try not to eat foods with trans fats.  Choose low-fat or nonfat dairy foods. ? Drink skim or nonfat milk. ? Eat low-fat or nonfat yogurt and cheeses. ? Try not to drink whole milk or cream. ? Try not to eat ice cream, egg yolks, or full-fat cheeses.  Healthy desserts include angel food cake, ginger snaps,  animal crackers, hard candy, popsicles, and low-fat or nonfat frozen yogurt. Try not to eat pastries, cakes, pies, and cookies.  Exercise Follow your exercise program as told by your doctor.  Be more active. Try gardening, walking, and taking the stairs.  Ask your doctor about ways that you can be more active.  Medicine  Take over-the-counter and prescription medicines only as told by your doctor.  This information is not intended to replace advice given to you by your health care provider. Make sure you discuss any questions you have with your health care provider. Document Released: 09/17/2008 Document Revised: 01/21/2016 Document Reviewed: 01/01/2016 Elsevier Interactive Patient Education  2017 [employer].

## 2016-12-31 DIAGNOSIS — E785 Hyperlipidemia, unspecified: Secondary | ICD-10-CM | POA: Insufficient documentation

## 2017-01-04 ENCOUNTER — Telehealth: Payer: Self-pay | Admitting: Cardiovascular Disease

## 2017-01-04 NOTE — Telephone Encounter (Signed)
New Message:   She wants to know if it is possible to [place] to [place].If the doctor agree to this change,pt will get two prescriptions with no cost.

## 2017-01-04 NOTE — Telephone Encounter (Signed)
Routed this call to [facility] for further review and follow-up with [facility].  Our Pharmacist recently saw this pt for lipid management, and submitted coverage for Praluent injection.

## 2017-01-06 NOTE — Telephone Encounter (Signed)
Returned call to [place] [employer]. Ok to change to Repatha as preferred agent. Had to leave message. Per their message a decision about updated coverage will be made and faxed to office within 72 hours. RX needs to be filled through [facility].     LMOM to call back. Need to make aware that preferred product is Repatha.

## 2017-01-10 MED ORDER — EVOLOCUMAB 140 MG/ML ~~LOC~~ SOAJ: 140.0000 mg | SUBCUTANEOUS | 11 refills | Status: DC

## 2017-01-10 NOTE — Telephone Encounter (Signed)
Spoke with pt's wife and alerted her of change from Praluent to [place]. Provided her with number for [facility]. She will call with any issues.

## 2017-01-10 NOTE — Telephone Encounter (Signed)
Prior auth approved for Repatha - rx sent to [facility].  LMOM again for pt to return call to alert him of change from Praluent to [place].

## 2017-03-03 ENCOUNTER — Telehealth: Payer: Self-pay | Admitting: Pharmacist

## 2017-03-03 DIAGNOSIS — E785 Hyperlipidemia, unspecified: Secondary | ICD-10-CM

## 2017-03-03 NOTE — Telephone Encounter (Signed)
LMOM to f/u if patient received Repatha and to schedule labs.

## 2017-03-08 NOTE — Telephone Encounter (Addendum)
Spoke with patient and he has started Repatha and has  taken 4 injections. He is tolerating the medication without side effects. Pt scheduled for labs for next week. He also states he has a Quarry manager from his insurance company about a renewal. He will bring it with him to his lab appt.

## 2017-03-16 ENCOUNTER — Other Ambulatory Visit: Payer: Federal, State, Local not specified - PPO | Admitting: *Deleted

## 2017-03-16 DIAGNOSIS — E785 Hyperlipidemia, unspecified: Secondary | ICD-10-CM

## 2017-03-16 LAB — LIPID PANEL
CHOL/HDL RATIO: 2.4 ratio (ref 0.0–5.0)
Cholesterol, Total: 86 mg/dL — ABNORMAL LOW (ref 100–199)
HDL: 36 mg/dL — AB (ref >39)
LDL CALC: 30 mg/dL (ref 0–99)
Triglycerides: 102 mg/dL (ref 0–149)
VLDL CHOLESTEROL CAL: 20 mg/dL (ref 5–40)

## 2017-03-16 LAB — HEPATIC FUNCTION PANEL
ALBUMIN: 4.4 g/dL (ref 3.5–5.5)
ALT: 16 IU/L (ref 0–44)
AST: 16 IU/L (ref 0–40)
Alkaline Phosphatase: 57 IU/L (ref 39–117)
Bilirubin Total: 0.6 mg/dL (ref 0.0–1.2)
Bilirubin, Direct: 0.22 mg/dL (ref 0.00–0.40)
TOTAL PROTEIN: 6.6 g/dL (ref 6.0–8.5)

## 2017-03-17 NOTE — Telephone Encounter (Signed)
Received prior authorization notification that pt dropped off yesterday. Submitted new PA request today for continuation of Repatha therapy since pt has responded well with 70% reduction in LDL since starting therapy.

## 2017-05-22 DIAGNOSIS — J0110 Acute frontal sinusitis, unspecified: Secondary | ICD-10-CM | POA: Diagnosis not present

## 2017-06-01 DIAGNOSIS — I2510 Atherosclerotic heart disease of native coronary artery without angina pectoris: Secondary | ICD-10-CM | POA: Diagnosis not present

## 2017-06-01 DIAGNOSIS — E782 Mixed hyperlipidemia: Secondary | ICD-10-CM | POA: Diagnosis not present

## 2017-06-01 DIAGNOSIS — N401 Enlarged prostate with lower urinary tract symptoms: Secondary | ICD-10-CM | POA: Diagnosis not present

## 2017-06-01 DIAGNOSIS — N138 Other obstructive and reflux uropathy: Secondary | ICD-10-CM | POA: Diagnosis not present

## 2017-06-01 DIAGNOSIS — I10 Essential (primary) hypertension: Secondary | ICD-10-CM | POA: Diagnosis not present

## 2017-06-01 DIAGNOSIS — Z23 Encounter for immunization: Secondary | ICD-10-CM | POA: Diagnosis not present

## 2017-06-01 DIAGNOSIS — K219 Gastro-esophageal reflux disease without esophagitis: Secondary | ICD-10-CM | POA: Diagnosis not present

## 2017-06-24 ENCOUNTER — Ambulatory Visit: Payer: Federal, State, Local not specified - PPO | Admitting: Urology

## 2017-06-24 DIAGNOSIS — N5201 Erectile dysfunction due to arterial insufficiency: Secondary | ICD-10-CM

## 2017-06-24 DIAGNOSIS — R351 Nocturia: Secondary | ICD-10-CM

## 2017-06-24 DIAGNOSIS — N401 Enlarged prostate with lower urinary tract symptoms: Secondary | ICD-10-CM | POA: Diagnosis not present

## 2017-08-29 ENCOUNTER — Other Ambulatory Visit: Payer: Self-pay | Admitting: Cardiovascular Disease

## 2017-08-29 ENCOUNTER — Ambulatory Visit (INDEPENDENT_AMBULATORY_CARE_PROVIDER_SITE_OTHER): Payer: Federal, State, Local not specified - PPO

## 2017-08-29 DIAGNOSIS — I10 Essential (primary) hypertension: Secondary | ICD-10-CM

## 2017-08-29 DIAGNOSIS — I2510 Atherosclerotic heart disease of native coronary artery without angina pectoris: Secondary | ICD-10-CM | POA: Diagnosis not present

## 2017-08-29 DIAGNOSIS — E785 Hyperlipidemia, unspecified: Secondary | ICD-10-CM

## 2017-08-30 LAB — EXERCISE TOLERANCE TEST
CHL CUP MPHR: 161 {beats}/min
CHL CUP RESTING HR STRESS: 70 {beats}/min
CHL RATE OF PERCEIVED EXERTION: 19
CSEPEDS: 0 s
CSEPPHR: 162 {beats}/min
Estimated workload: 10.1 METS
Exercise duration (min): 9 min
Percent HR: 101 %

## 2017-09-02 ENCOUNTER — Encounter: Payer: Self-pay | Admitting: Cardiovascular Disease

## 2017-09-02 ENCOUNTER — Ambulatory Visit: Payer: Federal, State, Local not specified - PPO | Admitting: Cardiovascular Disease

## 2017-09-02 VITALS — BP 110/76 | HR 76 | Ht 73.5 in | Wt 244.8 lb

## 2017-09-02 DIAGNOSIS — E785 Hyperlipidemia, unspecified: Secondary | ICD-10-CM

## 2017-09-02 DIAGNOSIS — I2510 Atherosclerotic heart disease of native coronary artery without angina pectoris: Secondary | ICD-10-CM

## 2017-09-02 NOTE — Patient Instructions (Signed)

## 2017-09-02 NOTE — Progress Notes (Signed)
Cardiology Office Note Date:  09/02/2017   ID:  [name], DOB 1960-12-25, MRN [id number]  PCP:  Vicenta Aly, [place]  Cardiologist:  Sherren Mocha, MD    Chief Complaint  Patient presents with  . Follow-up    CAD     History of Present Illness: [name] is a 57 y.o. male who presents for follow-up of coronary artery disease. He initially underwent stenting of the LAD in 1998. His most recent heart catheterization in 2010 demonstrated patency of his stent site with 30% in-stent restenosis and otherwise widely patent coronary arteries. The patient's left ventricular function has been normal.   The patient is here with his wife today.  He is doing well with no specific cardiac complaints.  He denies chest pain, shortness of breath, or heart palpitations.  He is about 3 years away from retirement at the [employer].  He is not engaged in regular exercise as he is generally quite tired at the end of his workday.   Past Medical History:  Diagnosis Date  . BPH (benign prostatic hyperplasia)   . Hypertension     Past Surgical History:  Procedure Laterality Date  . APPENDECTOMY    . CHOLECYSTECTOMY    . CORONARY ANGIOPLASTY WITH STENT PLACEMENT     November 1998  . ESOPHAGOGASTRODUODENOSCOPY N/A 04/12/2013   Procedure: ESOPHAGOGASTRODUODENOSCOPY (EGD);  Surgeon: Rogene Houston, MD;  [facility]: [facility];  Service: Endoscopy;  Laterality: N/A;  325-moved to 130 Melanie notified pt  . VASECTOMY      Current Outpatient Medications  Medication Sig Dispense Refill  . alprostadil (EDEX) 20 MCG injection 20 mcg by Intracavitary route as directed. use no more than 3 times per week    . aspirin 81 MG tablet Take 81 mg by mouth daily.    Marland Kitchen augmented betamethasone dipropionate (DIPROLENE-AF) 0.05 % cream Apply 1 application topically 2 (two) times daily.     Marland Kitchen azelastine (ASTELIN) 0.1 % nasal spray Place 1 spray into both nostrils 2 (two) times daily. Use in each nostril as  directed     . Evolocumab (REPATHA SURECLICK) 500 MG/ML SOAJ Inject 140 mg into the skin every 14 (fourteen) days. 2 pen 11  . montelukast (SINGULAIR) 10 MG tablet Take 10 mg by mouth at bedtime.    . naproxen sodium (ANAPROX) 220 MG tablet Take 220 mg by mouth daily as needed (pain).     Marland Kitchen olmesartan (BENICAR) 20 MG tablet Take 10 mg by mouth daily.     . sucralfate (CARAFATE) 1 g tablet Take 1 g by mouth 2 (two) times daily.    . tamsulosin (FLOMAX) 0.4 MG CAPS Take 0.4 mg by mouth daily.     No current facility-administered medications for this visit.     Allergies:   Levofloxacin and Statins   Social History:  The patient  reports that he quit smoking about 21 years ago. His smoking use included cigarettes. he has never used smokeless tobacco. He reports that he drinks about 1.2 oz of alcohol per week. He reports that he does not use drugs.   Family History:  The patient's family history includes Aneurysm in his mother; Cancer in his maternal grandfather and maternal grandmother; Epilepsy in his mother; Heart disease in his father; Hypertension in his mother.    ROS:  Please see the history of present illness. All other systems are reviewed and negative.    PHYSICAL EXAM: VS:  BP 110/76  Pulse 76   Ht 6' 1.5" (1.867 m)   Wt 244 lb 12.8 oz (111 kg)   BMI 31.86 kg/m  , BMI Body mass index is 31.86 kg/m. GEN: Well nourished, well developed, in no acute distress  HEENT: normal  Neck: no JVD, no masses. No carotid bruits Cardiac: RRR without murmur or gallop                Respiratory:  clear to auscultation bilaterally, normal work of breathing GI: soft, nontender, nondistended, + BS MS: no deformity or atrophy  Ext: no pretibial edema, pedal pulses 2+= bilaterally Skin: warm and dry, no rash Neuro:  Strength and sensation are intact Psych: euthymic mood, full affect  EKG:  EKG is not ordered today.  Recent Labs: 03/16/2017: ALT 16   Lipid Panel     Component Value  Date/Time   CHOL 86 (L) 03/16/2017 1017   TRIG 102 03/16/2017 1017   HDL 36 (L) 03/16/2017 1017   CHOLHDL 2.4 03/16/2017 1017   CHOLHDL 4.8 12/04/2008 0711   VLDL 14 12/04/2008 0711   LDLCALC 30 03/16/2017 1017      Wt Readings from Last 3 Encounters:  09/02/17 244 lb 12.8 oz (111 kg)  08/27/16 239 lb 9.6 oz (108.7 kg)  12/03/15 244 lb 14.4 oz (111.1 kg)     Cardiac Studies Reviewed: GXT 08-29-2017: Study Highlights     Blood pressure demonstrated a normal response to exercise.  Upsloping ST segment depression ST segment depression was noted during stress.   1.  The patient exercised for 9 minutes according to the Bruce protocol, achieving 74.2 metabolic equivalents 2.  The patient achieved 100% of his maximal predicted heart rate 3.  There is upsloping ST depression resolving early in recovery, unchanged from previous studies Overall interpretation is low risk   ASSESSMENT AND PLAN: 1.  CAD, native vessel, without angina: He continues on aspirin and Repatha for lipid lowering.  The patient is statin intolerant.  He has no anginal symptoms.  His stress test is reviewed as outlined above.  I will see him back in 1 year.  I encouraged him to try to start regular exercise.  2.  Hyperlipidemia: Excellent lipid panel as outlined above.  He will continue on Repatha.  Lifestyle modification discussed.  3.  Hypertension: Patient is treated with Benicar and his blood pressure is under ideal control.  Current medicines are reviewed with the patient today.  The patient does not have concerns regarding medicines.  Labs/ tests ordered today include:  No orders of the defined types were placed in this encounter.   Disposition:   FU one year  Signed, Sherren Mocha, MD  09/02/2017 5:24 PM    [address], [place], [place]  [address] Phone: [phone]; Fax: [phone]

## 2017-09-14 ENCOUNTER — Other Ambulatory Visit (INDEPENDENT_AMBULATORY_CARE_PROVIDER_SITE_OTHER): Payer: Self-pay | Admitting: Internal Medicine

## 2017-10-10 ENCOUNTER — Other Ambulatory Visit (INDEPENDENT_AMBULATORY_CARE_PROVIDER_SITE_OTHER): Payer: Self-pay | Admitting: Internal Medicine

## 2017-10-18 ENCOUNTER — Telehealth (INDEPENDENT_AMBULATORY_CARE_PROVIDER_SITE_OTHER): Payer: Self-pay | Admitting: Internal Medicine

## 2017-10-18 NOTE — Telephone Encounter (Signed)
Wife called in ref to a refill on medication - please call [name] at [phone]

## 2017-10-19 NOTE — Telephone Encounter (Signed)
Message left on answering machine 

## 2017-10-19 NOTE — Telephone Encounter (Signed)
Message left on answering machine for her to return my call.

## 2017-10-25 NOTE — Telephone Encounter (Signed)
No answer. I have tried call patient multiple times with no answer.

## 2017-11-02 ENCOUNTER — Encounter (INDEPENDENT_AMBULATORY_CARE_PROVIDER_SITE_OTHER): Payer: Self-pay | Admitting: Internal Medicine

## 2017-11-02 ENCOUNTER — Ambulatory Visit (INDEPENDENT_AMBULATORY_CARE_PROVIDER_SITE_OTHER): Payer: Federal, State, Local not specified - PPO | Admitting: Internal Medicine

## 2017-11-02 VITALS — BP 122/80 | HR 64 | Temp 97.9°F | Ht 73.5 in | Wt 244.2 lb

## 2017-11-02 DIAGNOSIS — K219 Gastro-esophageal reflux disease without esophagitis: Secondary | ICD-10-CM | POA: Diagnosis not present

## 2017-11-02 DIAGNOSIS — K588 Other irritable bowel syndrome: Secondary | ICD-10-CM | POA: Diagnosis not present

## 2017-11-02 MED ORDER — SUCRALFATE 1 G PO TABS: 1.0000 g | ORAL_TABLET | Freq: Two times a day (BID) | ORAL | 4 refills | Status: DC

## 2017-11-02 NOTE — Progress Notes (Signed)
Subjective:    Patient ID: [name], male    DOB: Nov 11, 1960, 57 y.o.   MRN: [id number]  HPI Here today for f/u. Has not been seen since May of 2017. Wt 244 in 2017. Today his weight is 244. He tells me he is doing good. Rarely has acid reflux. He does occasionally if he eats something he shouldn't. He uses Carafate for IBS. He says his BMs are more regular. No melena or BRRB.  No family hx of colon cancer.    04/12/2013 EGD: left upper quadrant pain, GERD;  Impression: Focal erythema distal esophagus suggestive of healing erosions. Few small hyperplastic polyps in proximal stomach. These were left alone. Pyloric channel inflammation without ulcer or stenosis.   His last colonoscopy was in May 2010 in [place] revealing one small polyp which was hyperplastic.  Review of Systems Past Medical History:  Diagnosis Date  . BPH (benign prostatic hyperplasia)   . Hypertension     Past Surgical History:  Procedure Laterality Date  . APPENDECTOMY    . CHOLECYSTECTOMY    . CORONARY ANGIOPLASTY WITH STENT PLACEMENT     November 1998  . ESOPHAGOGASTRODUODENOSCOPY N/A 04/12/2013   Procedure: ESOPHAGOGASTRODUODENOSCOPY (EGD);  Surgeon: Rogene Houston, MD;  [facility];  Service: Endoscopy;  Laterality: N/A;  325-moved to 130 Melanie notified pt  . VASECTOMY      Allergies  Allergen Reactions  . Levofloxacin Palpitations  . Statins Other (See Comments)    Muscles hurt. Muscles hurt.    Current Outpatient Medications on File Prior to Visit  Medication Sig Dispense Refill  . alprostadil (EDEX) 20 MCG injection 20 mcg by Intracavitary route as directed. use no more than 3 times per week    . aspirin 81 MG tablet Take 81 mg by mouth daily.    Marland Kitchen augmented betamethasone dipropionate (DIPROLENE-AF) 0.05 % cream Apply 1 application topically 2 (two) times daily.     Marland Kitchen azelastine (ASTELIN) 0.1 % nasal spray Place 1 spray into both nostrils 2 (two) times daily. Use in  each nostril as directed     . Evolocumab (REPATHA SURECLICK) 952 MG/ML SOAJ Inject 140 mg into the skin every 14 (fourteen) days. 2 pen 11  . montelukast (SINGULAIR) 10 MG tablet Take 10 mg by mouth at bedtime.    . naproxen sodium (ANAPROX) 220 MG tablet Take 220 mg by mouth daily as needed (pain).     Marland Kitchen olmesartan (BENICAR) 20 MG tablet Take 10 mg by mouth daily.     . sucralfate (CARAFATE) 1 g tablet TAKE 1 TABLET BY MOUTH TWICE A DAY 60 tablet 0  . tamsulosin (FLOMAX) 0.4 MG CAPS Take 0.4 mg by mouth daily.     No current facility-administered medications on file prior to visit.         Objective:   Physical Exam Blood pressure 122/80, pulse 64, temperature 97.9 F (36.6 C), height 6' 1.5" (1.867 m), weight 244 lb 3.2 oz (110.8 kg). Alert and oriented. Skin warm and dry. Oral mucosa is moist.   . Sclera anicteric, conjunctivae is pink. Thyroid not enlarged. No cervical lymphadenopathy. Lungs clear. Heart regular rate and rhythm.  Abdomen is soft. Bowel sounds are positive. No hepatomegaly. No abdominal masses felt. No tenderness.  No edema to lower extremities. Patient is alert and oriented.         Assessment & Plan:  GERD. He is doing well. Rarely has. IBS: He will continue the Carafate.  OV in 1 year

## 2017-11-02 NOTE — Patient Instructions (Signed)
Rx for Carafate.  OV in 1 year. 

## 2017-11-23 DIAGNOSIS — I10 Essential (primary) hypertension: Secondary | ICD-10-CM | POA: Diagnosis not present

## 2017-11-23 DIAGNOSIS — Z0000 Encounter for general adult medical examination without abnormal findings: Secondary | ICD-10-CM | POA: Diagnosis not present

## 2017-11-23 DIAGNOSIS — N401 Enlarged prostate with lower urinary tract symptoms: Secondary | ICD-10-CM | POA: Diagnosis not present

## 2017-11-23 DIAGNOSIS — E782 Mixed hyperlipidemia: Secondary | ICD-10-CM | POA: Diagnosis not present

## 2017-12-07 ENCOUNTER — Other Ambulatory Visit: Payer: Self-pay | Admitting: Pharmacist

## 2017-12-07 MED ORDER — EVOLOCUMAB 140 MG/ML ~~LOC~~ SOAJ: 140.0000 mg | SUBCUTANEOUS | 11 refills | Status: DC

## 2018-02-22 ENCOUNTER — Other Ambulatory Visit: Payer: Self-pay

## 2018-02-22 ENCOUNTER — Emergency Department (HOSPITAL_COMMUNITY): Payer: Federal, State, Local not specified - PPO

## 2018-02-22 ENCOUNTER — Encounter (HOSPITAL_COMMUNITY): Payer: Self-pay | Admitting: Emergency Medicine

## 2018-02-22 ENCOUNTER — Emergency Department (HOSPITAL_COMMUNITY)
Admission: EM | Admit: 2018-02-22 | Discharge: 2018-02-23 | Disposition: A | Discharge status: Home / Self Care | Payer: Federal, State, Local not specified - PPO | Attending: Emergency Medicine | Admitting: Emergency Medicine

## 2018-02-22 DIAGNOSIS — Z79899 Other long term (current) drug therapy: Secondary | ICD-10-CM | POA: Insufficient documentation

## 2018-02-22 DIAGNOSIS — I10 Essential (primary) hypertension: Secondary | ICD-10-CM | POA: Insufficient documentation

## 2018-02-22 DIAGNOSIS — R519 Headache, unspecified: Secondary | ICD-10-CM

## 2018-02-22 DIAGNOSIS — R51 Headache: Secondary | ICD-10-CM | POA: Diagnosis not present

## 2018-02-22 DIAGNOSIS — R6883 Chills (without fever): Secondary | ICD-10-CM | POA: Insufficient documentation

## 2018-02-22 DIAGNOSIS — R110 Nausea: Secondary | ICD-10-CM | POA: Diagnosis not present

## 2018-02-22 DIAGNOSIS — G4489 Other headache syndrome: Secondary | ICD-10-CM | POA: Diagnosis not present

## 2018-02-22 DIAGNOSIS — I959 Hypotension, unspecified: Secondary | ICD-10-CM | POA: Diagnosis not present

## 2018-02-22 DIAGNOSIS — Z7982 Long term (current) use of aspirin: Secondary | ICD-10-CM | POA: Diagnosis not present

## 2018-02-22 DIAGNOSIS — Z87891 Personal history of nicotine dependence: Secondary | ICD-10-CM | POA: Insufficient documentation

## 2018-02-22 LAB — CBC WITH DIFFERENTIAL/PLATELET
BASOS ABS: 0 10*3/uL (ref 0.0–0.1)
BASOS PCT: 0 %
EOS ABS: 0.3 10*3/uL (ref 0.0–0.7)
EOS PCT: 3 %
HCT: 42.9 % (ref 39.0–52.0)
Hemoglobin: 14.5 g/dL (ref 13.0–17.0)
Lymphocytes Relative: 25 %
Lymphs Abs: 2.1 10*3/uL (ref 0.7–4.0)
MCH: 28.3 pg (ref 26.0–34.0)
MCHC: 33.8 g/dL (ref 30.0–36.0)
MCV: 83.6 fL (ref 78.0–100.0)
MONO ABS: 0.5 10*3/uL (ref 0.1–1.0)
Monocytes Relative: 6 %
Neutro Abs: 5.7 10*3/uL (ref 1.7–7.7)
Neutrophils Relative %: 66 %
PLATELETS: 161 10*3/uL (ref 150–400)
RBC: 5.13 MIL/uL (ref 4.22–5.81)
RDW: 12.6 % (ref 11.5–15.5)
WBC: 8.7 10*3/uL (ref 4.0–10.5)

## 2018-02-22 MED ORDER — FLUORESCEIN SODIUM 1 MG OP STRP
ORAL_STRIP | OPHTHALMIC | Status: AC
  Filled 2018-02-22: qty 1

## 2018-02-22 MED ORDER — TETRACAINE HCL 0.5 % OP SOLN
1.0000 [drp] | Freq: Once | OPHTHALMIC | Status: AC
  Administered 2018-02-23: 2 [drp] via OPHTHALMIC
  Filled 2018-02-22: qty 4

## 2018-02-22 NOTE — ED Triage Notes (Signed)
Pt c/o gradual headache today. States worse over last few hours. Has taken several excedrin with no relief. C/o nasuea. Denies vomiting/weaknes on one side/trouble swallowing or speaking. Pt is a/o.

## 2018-02-22 NOTE — ED Provider Notes (Signed)
[facility] EMERGENCY DEPARTMENT Provider Note   CSN: [id number] Arrival date & time: 02/22/18  2104     History   Chief Complaint Chief Complaint  Patient presents with  . Headache    HPI BURR SOFFER is a 57 y.o. male who presents with a headache.  Past medical history significant for hypertension, BPH, coronary artery disease.  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  He states he did not think much of it however when he finishes route the headache had worsened therefore he went home and took an Aleve.  This did not help so he took an Excedrin and tried to lie down.  He was able to nap for a couple of hours but woke up and the pain had worsened again.  He tried to take another Excedrin but the pain was severe he decided to come to the ED.  The pain is primarily over the left side of his head and it feels like a pressure behind his eye.  He states that the pain feels like a "brain freeze" that will not go away.  He applied a warm compress and this did help. He reports associated chills and nausea but has not had any vomiting.  He denies fever, LOC, head trauma, dizziness, neck pain, unilateral weakness, vision changes.  He has never had a headache like this before.  He states he does have issues with sinus headaches and tension headaches at times but this feels very different.    HPI  Past Medical History:  Diagnosis Date  . BPH (benign prostatic hyperplasia)   . Hypertension     Patient Active Problem List   Diagnosis Date Noted  . Hyperlipidemia 12/31/2016  . Coronary atherosclerosis of native coronary artery 08/01/2013  . GERD (gastroesophageal reflux disease) 03/27/2013  . Abdominal pain, left upper quadrant 03/27/2013  . Chest pain 09/08/2012  . HTN (hypertension) 09/08/2012    Past Surgical History:  Procedure Laterality Date  . APPENDECTOMY    . CHOLECYSTECTOMY    . CORONARY ANGIOPLASTY  WITH STENT PLACEMENT     November 1998  . ESOPHAGOGASTRODUODENOSCOPY N/A 04/12/2013   Procedure: ESOPHAGOGASTRODUODENOSCOPY (EGD);  Surgeon: Rogene Houston, MD;  [facility];  Service: Endoscopy;  Laterality: N/A;  325-moved to 130 Melanie notified pt  . VASECTOMY          Home Medications    Prior to Admission medications   Medication Sig Start Date End Date Taking? Authorizing Provider  alprostadil (EDEX) 20 MCG injection 20 mcg by Intracavitary route as directed. use no more than 3 times per week   Yes [provider]  aspirin 81 MG tablet Take 81 mg by mouth daily.   Yes [provider]  Aspirin-Acetaminophen-Caffeine (EXCEDRIN EXTRA STRENGTH PO) Take 1 tablet by mouth 2 (two) times daily as needed (for pain).   Yes [provider]  augmented betamethasone dipropionate (DIPROLENE-AF) 0.05 % cream Apply 1 application topically 2 (two) times daily.    Yes [provider]  azelastine (ASTELIN) 0.1 % nasal spray Place 1 spray into both nostrils daily as needed for allergies. Use in each nostril as directed    Yes [provider]  Evolocumab (REPATHA SURECLICK) 409 MG/ML SOAJ Inject 140 mg into the skin every 14 (fourteen) days. 12/07/17  Yes Sherren Mocha, MD  montelukast (SINGULAIR) 10 MG tablet Take 10 mg by mouth at bedtime.  Yes [provider]  naproxen sodium (ANAPROX) 220 MG tablet Take 220 mg by mouth daily as needed (pain).    Yes [provider]  olmesartan (BENICAR) 20 MG tablet Take 10 mg by mouth every evening.    Yes [provider]  sucralfate (CARAFATE) 1 g tablet Take 1 tablet (1 g total) by mouth 2 (two) times daily. 11/02/17  Yes Setzer, Terri L, NP  tamsulosin (FLOMAX) 0.4 MG CAPS Take 0.4 mg by mouth every evening.    Yes [provider]    Family History Family History  Problem Relation Age of Onset  . Hypertension Mother   . Epilepsy Mother   . Aneurysm Mother   . Heart  disease Father   . Cancer Maternal Grandmother   . Cancer Maternal Grandfather     Social History Social History   Tobacco Use  . Smoking status: Former Smoker    Types: Cigarettes    Last attempt to quit: 05/15/1996    Years since quitting: 21.7  . Smokeless tobacco: Never Used  . Tobacco comment: quit 08/1996  Substance Use Topics  . Alcohol use: Yes    Alcohol/week: 2.0 standard drinks    Types: 2 Cans of beer per week    Comment: rare twice a week  . Drug use: No     Allergies   Levofloxacin and Statins   Review of Systems Review of Systems  Constitutional: Positive for chills. Negative for fever.  Eyes: Negative for photophobia, pain and visual disturbance.  Gastrointestinal: Positive for nausea. Negative for vomiting.  Musculoskeletal: Negative for gait problem and neck pain.  Neurological: Positive for headaches. Negative for dizziness.  All other systems reviewed and are negative.    Physical Exam Updated Vital Signs BP 132/83 (BP Location: Right Arm)   Pulse 64   Temp 97.7 F (36.5 C) (Oral)   Resp 20   SpO2 100%   Physical Exam  Constitutional: He is oriented to person, place, and time. He appears well-developed and well-nourished. No distress.  Calm and cooperative  HENT:  Head: Normocephalic and atraumatic.  Tenderness over left temporal region  Eyes: Pupils are equal, round, and reactive to light. Conjunctivae are normal. Right eye exhibits no discharge. Left eye exhibits no discharge. No scleral icterus.  IOP in left: 8 IOP in right: 8  Neck: Normal range of motion. Neck supple.  Cardiovascular: Normal rate.  Pulmonary/Chest: Effort normal. No respiratory distress.  Abdominal: He exhibits no distension.  Neurological: He is alert and oriented to person, place, and time.  .Mental Status:  Alert, oriented, thought content appropriate, able to give a coherent history. Speech fluent without evidence of aphasia. Able to follow 2 step commands  without difficulty.  Cranial Nerves:  II:  Peripheral visual fields grossly normal, pupils equal, round, reactive to light III,IV, VI: ptosis not present, extra-ocular motions intact bilaterally  V,VII: smile symmetric, facial light touch sensation equal VIII: hearing grossly normal to voice  X: uvula elevates symmetrically  XI: bilateral shoulder shrug symmetric and strong XII: midline tongue extension without fassiculations Motor:  Normal tone. 5/5 in upper and lower extremities bilaterally including strong and equal grip strength and dorsiflexion/plantar flexion Sensory: Pinprick and light touch normal in all extremities.  Cerebellar: normal finger-to-nose with bilateral upper extremities Gait: normal gait and balance CV: distal pulses palpable throughout    Skin: Skin is warm and dry.  Psychiatric: He has a normal mood and affect. His behavior is normal.  Nursing  note and vitals reviewed.    ED Treatments / Results  Labs (all labs ordered are listed, but only abnormal results are displayed) Labs Reviewed  BASIC METABOLIC PANEL - Abnormal; Notable for the following components:      Result Value   Glucose, Bld 104 (*)    All other components within normal limits  CBC WITH DIFFERENTIAL/PLATELET  SEDIMENTATION RATE    EKG None  Radiology Ct Head Wo Contrast  Result Date: 02/22/2018 CLINICAL DATA:  Gradual headache today worsening over the past few hours. EXAM: CT HEAD WITHOUT CONTRAST TECHNIQUE: Contiguous axial images were obtained from the base of the skull through the vertex without intravenous contrast. COMPARISON:  09/26/2008 CT, 07/10/2016 CT head report FINDINGS: Brain: No evidence of acute infarction, hemorrhage, hydrocephalus, extra-axial collection or mass lesion/mass effect. Mild superficial atrophy of the cerebellum frontal lobes. Vascular: No hyperdense vessel sign. Skull: Negative for fracture or focal lesions. Sinuses/Orbits: Moderate-to-marked paranasal sinus  mucosal thickening is noted of the ethmoid and right maxillary sinus to the extent included. 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 Mild mucosal thickening is noted of the sphenoid sinus. Secretions are redemonstrated in the frontal sinuses. Other: None IMPRESSION: No acute intracranial abnormality. Mild stable involutional changes of brain. Chronic sinusitis with mucosal thickening as above. Electronically Signed   By: Ashley Royalty M.D.   On: 02/22/2018 23:41    Procedures Procedures (including critical care time)  Medications Ordered in ED Medications  tetracaine (PONTOCAINE) 0.5 % ophthalmic solution 1-2 drop (2 drops Left Eye Given by Other 02/23/18 0023)  acetaminophen (TYLENOL) tablet 650 mg (650 mg Oral Given 02/23/18 0016)     Initial Impression / Assessment and Plan / ED Course  I have reviewed the triage vital signs and the nursing notes.  Pertinent labs & imaging results that were available during my care of the patient were reviewed by me and considered in my medical decision making (see chart for details).  57 year old male presents with a severe left-sided headache which has been gradually worsening since earlier today.  Differential includes migraine, sinus headache, temporal arteritis, acute angle glaucoma.  Presentation sounds less likely due to subarachnoid hemorrhage since pain has gradually worsened and was not a thunderclap headache.  He has a normal neurologic exam.  His vital signs are normal.  Intraocular pressures are normal.  Will obtain blood work, sed rate, CT head to further evaluate  1AM CBC and BMP are normal.  CT shows significant chronic side sinusitis however it does appear to be worse on the right than the left.  Sed rate is still pending. He was given Tylenol and states pain is 2-3.  1:18 AM Sed rate is normal. Discussed results with the patient and wife. They are comfortable with dc. They were given strict return precautions.   Final Clinical  Impressions(s) / ED Diagnoses   Final diagnoses:  Bad headache    ED Discharge Orders    None       Recardo Evangelist, PA-C 02/23/18 0118    Francine Graven, DO 02/25/18 1233

## 2018-02-22 NOTE — ED Notes (Signed)
Pt to ct 

## 2018-02-23 LAB — BASIC METABOLIC PANEL
Anion gap: 7 (ref 5–15)
BUN: 14 mg/dL (ref 6–20)
CALCIUM: 9 mg/dL (ref 8.9–10.3)
CO2: 26 mmol/L (ref 22–32)
Chloride: 106 mmol/L (ref 98–111)
Creatinine, Ser: 0.89 mg/dL (ref 0.61–1.24)
GFR calc Af Amer: >60 mL/min (ref >60)
GLUCOSE: 104 mg/dL — AB (ref 70–99)
Potassium: 4 mmol/L (ref 3.5–5.1)
Sodium: 139 mmol/L (ref 135–145)

## 2018-02-23 LAB — SEDIMENTATION RATE: SED RATE: 2 mm/h (ref 0–16)

## 2018-02-23 MED ORDER — ACETAMINOPHEN 325 MG PO TABS
650.0000 mg | ORAL_TABLET | Freq: Once | ORAL | Status: AC
  Administered 2018-02-23: 650 mg via ORAL
  Filled 2018-02-23: qty 2

## 2018-02-23 NOTE — Discharge Instructions (Signed)
Please return if you are worsening

## 2018-04-10 ENCOUNTER — Telehealth: Payer: Self-pay | Admitting: Cardiovascular Disease

## 2018-04-10 NOTE — Telephone Encounter (Signed)
New Message:     Pt needs a prior authorization for his Repatha. Please call [employer] [phone].

## 2018-04-10 NOTE — Telephone Encounter (Signed)
Reauth was already faxed 10/2 and we are awaiting decision.

## 2018-04-20 ENCOUNTER — Telehealth: Payer: Self-pay | Admitting: Cardiovascular Disease

## 2018-04-20 DIAGNOSIS — E785 Hyperlipidemia, unspecified: Secondary | ICD-10-CM

## 2018-04-20 NOTE — Telephone Encounter (Signed)
New message  Per patient's wife need to get prior approval for Repatha. The insurance company [employer] is asking for prior approval to renew the prescription. Patient has the letter from insurance company if it is needed. Please contact to discuss.

## 2018-04-20 NOTE — Telephone Encounter (Signed)
I spoke with insurance this morning. They were waiting on additional chart notes. I faxed these over. I will call to follow up this afternoon to ensure that they received the information needed.

## 2018-04-21 NOTE — Telephone Encounter (Signed)
Spoke with patient who will come for repeat labs on Wednesday around 9am and to sign appeals letter for [employer].   Will also supply him with samples until reauth can be obtained.

## 2018-04-21 NOTE — Telephone Encounter (Addendum)
Spoke with representative from [employer] who states that Repatha has been denied. She reports that LDL reduction is not >40% from baseline; however, LDL went from 102 --> 30 (which is a 70.5% reduction). She states that will need updated labs and will require an appeal as is in a denied status. The patient is required to write the appeals.   Will have him come to repeat labs and sign appeals letter to have [facility] authorized.  LMOM to discuss with patient

## 2018-04-24 NOTE — Telephone Encounter (Signed)
See other phone encounter for additional details.

## 2018-04-26 ENCOUNTER — Other Ambulatory Visit: Payer: Federal, State, Local not specified - PPO | Admitting: *Deleted

## 2018-04-26 DIAGNOSIS — E785 Hyperlipidemia, unspecified: Secondary | ICD-10-CM

## 2018-04-26 LAB — HEPATIC FUNCTION PANEL
ALT: 23 IU/L (ref 0–44)
AST: 19 IU/L (ref 0–40)
Albumin: 4.4 g/dL (ref 3.5–5.5)
Alkaline Phosphatase: 57 IU/L (ref 39–117)
BILIRUBIN TOTAL: 0.5 mg/dL (ref 0.0–1.2)
BILIRUBIN, DIRECT: 0.14 mg/dL (ref 0.00–0.40)
Total Protein: 6.5 g/dL (ref 6.0–8.5)

## 2018-04-26 LAB — LIPID PANEL
CHOLESTEROL TOTAL: 123 mg/dL (ref 100–199)
Chol/HDL Ratio: 3.2 ratio (ref 0.0–5.0)
HDL: 38 mg/dL — AB (ref >39)
LDL CALC: 73 mg/dL (ref 0–99)
TRIGLYCERIDES: 61 mg/dL (ref 0–149)
VLDL Cholesterol Cal: 12 mg/dL (ref 5–40)

## 2018-04-27 NOTE — Telephone Encounter (Signed)
Faxed appeal today. Await response.

## 2018-05-01 ENCOUNTER — Telehealth: Payer: Self-pay

## 2018-05-02 NOTE — Telephone Encounter (Signed)
LMOM to advise pt

## 2018-05-02 NOTE — Telephone Encounter (Signed)
Appeals approved.   Pt should be able to resume Repatha therapy.

## 2018-05-02 NOTE — Telephone Encounter (Signed)
Pt returned call - he is aware to contact specialty pharmacy to resume Repatha shots.

## 2018-07-20 DIAGNOSIS — I2510 Atherosclerotic heart disease of native coronary artery without angina pectoris: Secondary | ICD-10-CM | POA: Diagnosis not present

## 2018-07-20 DIAGNOSIS — N401 Enlarged prostate with lower urinary tract symptoms: Secondary | ICD-10-CM | POA: Diagnosis not present

## 2018-07-20 DIAGNOSIS — E782 Mixed hyperlipidemia: Secondary | ICD-10-CM | POA: Diagnosis not present

## 2018-07-20 DIAGNOSIS — N138 Other obstructive and reflux uropathy: Secondary | ICD-10-CM | POA: Diagnosis not present

## 2018-07-20 DIAGNOSIS — Z79899 Other long term (current) drug therapy: Secondary | ICD-10-CM | POA: Diagnosis not present

## 2018-07-20 DIAGNOSIS — I10 Essential (primary) hypertension: Secondary | ICD-10-CM | POA: Diagnosis not present

## 2018-08-02 DIAGNOSIS — L570 Actinic keratosis: Secondary | ICD-10-CM | POA: Diagnosis not present

## 2018-08-02 DIAGNOSIS — X32XXXA Exposure to sunlight, initial encounter: Secondary | ICD-10-CM | POA: Diagnosis not present

## 2018-09-01 DIAGNOSIS — N401 Enlarged prostate with lower urinary tract symptoms: Secondary | ICD-10-CM | POA: Diagnosis not present

## 2018-09-01 DIAGNOSIS — N138 Other obstructive and reflux uropathy: Secondary | ICD-10-CM | POA: Diagnosis not present

## 2018-10-30 ENCOUNTER — Encounter (INDEPENDENT_AMBULATORY_CARE_PROVIDER_SITE_OTHER): Payer: Self-pay | Admitting: *Deleted

## 2018-11-02 ENCOUNTER — Ambulatory Visit (INDEPENDENT_AMBULATORY_CARE_PROVIDER_SITE_OTHER): Payer: Federal, State, Local not specified - PPO | Admitting: Internal Medicine

## 2018-11-02 ENCOUNTER — Encounter (INDEPENDENT_AMBULATORY_CARE_PROVIDER_SITE_OTHER): Payer: Self-pay | Admitting: Internal Medicine

## 2018-11-02 ENCOUNTER — Other Ambulatory Visit: Payer: Self-pay

## 2018-11-02 DIAGNOSIS — K219 Gastro-esophageal reflux disease without esophagitis: Secondary | ICD-10-CM | POA: Diagnosis not present

## 2018-11-02 DIAGNOSIS — K588 Other irritable bowel syndrome: Secondary | ICD-10-CM | POA: Diagnosis not present

## 2018-11-02 NOTE — Patient Instructions (Addendum)
Continue the Carafate OV in 1 year.

## 2018-11-02 NOTE — Progress Notes (Signed)
Subjective:  PCP Vicenta Aly FNP.   Patient ID: [name], male    DOB: 1961/05/27, 58 y.o.   MRN: [id number] Time 10:20. Ended 1030. Total time 10 minutes. Patient consent to the telephone OV. He is at home. I am in the office. Telephone OV due to the COVID-19 risk. Unable to do video OV. Hx of IBS and GERD.  HPI He was last seen in May of 2019. Hx of IBS and GERD. States he rarely has GERD. IBS controlled with Carafate. He has no GI complaints. His appetite is good. Stated weight today is 20-250. Last weight in May of 2019 was 244. Has a BM daily. No melena or BRRB. No family hx of colon cancer. His last colonoscopy was in 2010 with a hyperplastic polyp. He is due for a colonoscopy. Continues to work full time.    04/12/2013 EGD: left upper quadrant pain, GERD; Impression: Focal erythema distal esophagus suggestive of healing erosions. Few small hyperplastic polyps in proximal stomach. These were left alone. Pyloric channel inflammation without ulcer or stenosis.   His last colonoscopy was in May 2010 in [place] revealing one small polyp which was hyperplastic.   Review of Systems  Past Medical History:  Diagnosis Date  . BPH (benign prostatic hyperplasia)   . Hypertension     Past Surgical History:  Procedure Laterality Date  . APPENDECTOMY    . CHOLECYSTECTOMY    . CORONARY ANGIOPLASTY WITH STENT PLACEMENT     November 1998  . ESOPHAGOGASTRODUODENOSCOPY N/A 04/12/2013   Procedure: ESOPHAGOGASTRODUODENOSCOPY (EGD);  Surgeon: Rogene Houston, MD;  [facility];  Service: Endoscopy;  Laterality: N/A;  325-moved to 130 Melanie notified pt  . VASECTOMY      Allergies  Allergen Reactions  . Levofloxacin Palpitations  . Statins Other (See Comments)    Muscles hurt. Muscles hurt.    Current Outpatient Medications on File Prior to Visit  Medication Sig Dispense Refill  . alprostadil (EDEX) 20 MCG injection 20 mcg by Intracavitary route as  directed. use no more than 3 times per week    . aspirin 81 MG tablet Take 81 mg by mouth daily.    . Aspirin-Acetaminophen-Caffeine (EXCEDRIN EXTRA STRENGTH PO) Take 1 tablet by mouth 2 (two) times daily as needed (for pain).    Marland Kitchen augmented betamethasone dipropionate (DIPROLENE-AF) 0.05 % cream Apply 1 application topically 2 (two) times daily.     Marland Kitchen azelastine (ASTELIN) 0.1 % nasal spray Place 1 spray into both nostrils daily as needed for allergies. Use in each nostril as directed     . Evolocumab (REPATHA SURECLICK) 671 MG/ML SOAJ Inject 140 mg into the skin every 14 (fourteen) days. 2 pen 11  . montelukast (SINGULAIR) 10 MG tablet Take 10 mg by mouth at bedtime.    . naproxen sodium (ANAPROX) 220 MG tablet Take 220 mg by mouth daily as needed (pain).     Marland Kitchen olmesartan (BENICAR) 20 MG tablet Take 10 mg by mouth every evening.     . sucralfate (CARAFATE) 1 g tablet Take 1 tablet (1 g total) by mouth 2 (two) times daily. 180 tablet 4  . tamsulosin (FLOMAX) 0.4 MG CAPS Take 0.4 mg by mouth every evening.      No current facility-administered medications on file prior to visit.         Objective:   Physical Exam   deferred    Assessment & Plan:  GERD. He rarely has GERD. He is  doing well. IBS: Take Carafate and states is controlled. He will have OV in 1 year.

## 2018-11-14 ENCOUNTER — Telehealth: Payer: Self-pay | Admitting: Physician Assistant

## 2018-11-14 NOTE — Telephone Encounter (Signed)
PHONE visit on 11/17/2018      Phone Call to obtain consent    -   11/14/2018         Virtual Visit Pre-Appointment Phone Call  "(Name), I am calling you today to discuss your upcoming appointment. We are currently trying to limit exposure to the virus that causes COVID-19 by seeing patients at home rather than in the office."  1. "What is the BEST phone number to call the day of the visit?" - include this in appointment notes  2. Do you have or have access to (through a family member/friend) a smartphone with video capability that we can use for your visit?" a. If yes - list this number in appt notes as cell (if different from BEST phone #) and list the appointment type as a VIDEO visit in appointment notes b. If no - list the appointment type as a PHONE visit in appointment notes  3. Confirm consent - "In the setting of the current Covid19 crisis, you are scheduled for a (phone or video) visit with your provider on (date) at (time).  Just as we do with many in-office visits, in order for you to participate in this visit, we must obtain consent.  If you'd like, I can send this to your mychart (if signed up) or email for you to review.  Otherwise, I can obtain your verbal consent now.  All virtual visits are billed to your insurance company just like a normal visit would be.  By agreeing to a virtual visit, we'd like you to understand that the technology does not allow for your provider to perform an examination, and thus may limit your provider's ability to fully assess your condition. If your provider identifies any concerns that need to be evaluated in person, we will make arrangements to do so.  Finally, though the technology is pretty good, we cannot assure that it will always work on either your or our end, and in the setting of a video visit, we may have to convert it to a phone-only visit.  In either situation, we cannot ensure that we have a secure connection.  Are you  willing to proceed?" STAFF: Did the patient verbally acknowledge consent to telehealth visit? Document YES/NO here: YES  4. Advise patient to be prepared - "Two hours prior to your appointment, go ahead and check your blood pressure, pulse, oxygen saturation, and your weight (if you have the equipment to check those) and write them all down. When your visit starts, your provider will ask you for this information. If you have an Apple Watch or Kardia device, please plan to have heart rate information ready on the day of your appointment. Please have a pen and paper handy nearby the day of the visit as well."  5. Give patient instructions for MyChart download to smartphone OR Doximity/Doxy.me as below if video visit (depending on what platform provider is using)  6. Inform patient they will receive a phone call 15 minutes prior to their appointment time (may be from unknown caller ID) so they should be prepared to answer    TELEPHONE CALL NOTE  [name] has been deemed a candidate for a follow-up tele-health visit to limit community exposure during the Covid-19 pandemic. I spoke with the patient via phone to ensure availability of phone/video source, confirm preferred email & phone number, and discuss instructions and expectations.  I reminded [name] to be prepared with any vital  sign and/or heart rhythm information that could potentially be obtained via home monitoring, at the time of his visit. I reminded [name] to expect a phone call prior to his visit.  Thayer Headings 11/14/2018 2:51 PM   INSTRUCTIONS FOR DOWNLOADING THE MYCHART APP TO SMARTPHONE  - The patient must first make sure to have activated MyChart and know their login information - If Apple, go to [employer] and type in MyChart in the search bar and download the app. If Android, ask patient to go to [employer] and type in [place] in the search bar and download the app. The app is free but as with any other app  downloads, their phone may require them to verify saved payment information or Apple/Android password.  - The patient will need to then log into the app with their MyChart username and password, and select [facility] as their healthcare provider to link the account. When it is time for your visit, go to the MyChart app, find appointments, and click Begin Video Visit. Be sure to Select Allow for your device to access the Microphone and Camera for your visit. You will then be connected, and your provider will be with you shortly.  **If they have any issues connecting, or need assistance please contact MyChart service desk [phone])**  **If using a computer, in order to ensure the best quality for their visit they will need to use either of the following Internet Browsers: [employer], or Google Chrome**  IF USING DOXIMITY or DOXY.ME - The patient will receive a link just prior to their visit by text.     FULL LENGTH CONSENT FOR TELE-HEALTH VISIT   I hereby voluntarily request, consent and authorize [facility] and its employed or contracted physicians, physician assistants, nurse practitioners or other licensed health care professionals (the Practitioner), to provide me with telemedicine health care services (the Services") as deemed necessary by the treating Practitioner. I acknowledge and consent to receive the Services by the Practitioner via telemedicine. I understand that the telemedicine visit will involve communicating with the Practitioner through live audiovisual communication technology and the disclosure of certain medical information by electronic transmission. I acknowledge that I have been given the opportunity to request an in-person assessment or other available alternative prior to the telemedicine visit and am voluntarily participating in the telemedicine visit.  I understand that I have the right to withhold or withdraw my consent to the use of telemedicine  in the course of my care at any time, without affecting my right to future care or treatment, and that the Practitioner or I may terminate the telemedicine visit at any time. I understand that I have the right to inspect all information obtained and/or recorded in the course of the telemedicine visit and may receive copies of available information for a reasonable fee.  I understand that some of the potential risks of receiving the Services via telemedicine include:   Delay or interruption in medical evaluation due to technological equipment failure or disruption;  Information transmitted may not be sufficient (e.g. poor resolution of images) to allow for appropriate medical decision making by the Practitioner; and/or   In rare instances, security protocols could fail, causing a breach of personal health information.  Furthermore, I acknowledge that it is my responsibility to provide information about my medical history, conditions and care that is complete and accurate to the best of my ability. I acknowledge that Practitioner's advice, recommendations, and/or decision may be  based on factors not within their control, such as incomplete or inaccurate data provided by me or distortions of diagnostic images or specimens that may result from electronic transmissions. I understand that the practice of medicine is not an exact science and that Practitioner makes no warranties or guarantees regarding treatment outcomes. I acknowledge that I will receive a copy of this consent concurrently upon execution via email to the email address I last provided but may also request a printed copy by calling the office of [facility].    I understand that my insurance will be billed for this visit.   I have read or had this consent read to me.  I understand the contents of this consent, which adequately explains the benefits and risks of the Services being provided via telemedicine.   I have been provided ample  opportunity to ask questions regarding this consent and the Services and have had my questions answered to my satisfaction.  I give my informed consent for the services to be provided through the use of telemedicine in my medical care  By participating in this telemedicine visit I agree to the above.

## 2018-11-16 NOTE — Progress Notes (Signed)
Virtual Visit via Telephone Note   This visit type was conducted due to national recommendations for restrictions regarding the COVID-19 Pandemic (e.g. social distancing) in an effort to limit this patient's exposure and mitigate transmission in our community.  Due to his co-morbid illnesses, this patient is at least at moderate risk for complications without adequate follow up.  This format is felt to be most appropriate for this patient at this time.  The patient did not have access to video technology/had technical difficulties with video requiring transitioning to audio format only (telephone).  All issues noted in this document were discussed and addressed.  No physical exam could be performed with this format.  Please refer to the patient's chart for his consent to telehealth for [facility].   Date:  11/17/2018   ID:  [name], DOB May 25, 1961, MRN [id number]  Patient Location: Home Provider Location: Home  PCP:  Vicenta Aly, [place]  Cardiologist:  Sherren Mocha, MD   Electrophysiologist:  None   Evaluation Performed:  Follow-Up Visit  Chief Complaint: Follow-up on coronary artery disease  History of Present Illness:    [name] is a 58 y.o. male with coronary artery disease s/p BMS to the LAD in 1998, hypertension, hyperlipidemia (intol to statins >> treated with Repatha).  Cardiac Catheterization in 2010 demonstrated a patent LAD stent and no significant coronary artery disease elsewhere.  Exercise tolerance test in 2019 was low risk.    Today, he notes he is doing well.  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  He denies chest pain, shortness of breath, syncope, orthopnea, PND or significant pedal edema.  The patient does not have symptoms concerning for COVID-19 infection (fever, chills, cough, or new shortness of breath).    Past Medical History:  Diagnosis Date  . BPH (benign prostatic hyperplasia)   . Coronary artery disease  08/01/2013   BMS to LAD in 1998 // [place] in 2010 with patent stent // ETT 08/2017 Low Risk  . Hypertension    Past Surgical History:  Procedure Laterality Date  . APPENDECTOMY    . CHOLECYSTECTOMY    . CORONARY ANGIOPLASTY WITH STENT PLACEMENT     November 1998  . ESOPHAGOGASTRODUODENOSCOPY N/A 04/12/2013   Procedure: ESOPHAGOGASTRODUODENOSCOPY (EGD);  Surgeon: Rogene Houston, MD;  [facility];  Service: Endoscopy;  Laterality: N/A;  325-moved to 130 Melanie notified pt  . VASECTOMY       Current Meds  Medication Sig  . alprostadil (EDEX) 20 MCG injection 20 mcg by Intracavitary route as directed. use no more than 3 times per week  . aspirin 81 MG tablet Take 81 mg by mouth daily.  . Aspirin-Acetaminophen-Caffeine (EXCEDRIN EXTRA STRENGTH PO) Take 1 tablet by mouth 2 (two) times daily as needed (for pain).  Marland Kitchen augmented betamethasone dipropionate (DIPROLENE-AF) 0.05 % cream Apply 1 application topically 2 (two) times daily.   Marland Kitchen azelastine (ASTELIN) 0.1 % nasal spray Place 1 spray into both nostrils daily as needed for allergies. Use in each nostril as directed   . Evolocumab (REPATHA SURECLICK) 182 MG/ML SOAJ Inject 140 mg into the skin every 14 (fourteen) days.  . montelukast (SINGULAIR) 10 MG tablet Take 10 mg by mouth at bedtime.  . naproxen sodium (ANAPROX) 220 MG tablet Take 220 mg by mouth daily as needed (pain).   Marland Kitchen olmesartan (BENICAR) 20 MG tablet Take 10 mg by mouth every evening.   . sucralfate (CARAFATE) 1 g  tablet Take 1 tablet (1 g total) by mouth 2 (two) times daily.  . tamsulosin (FLOMAX) 0.4 MG CAPS Take 0.4 mg by mouth every evening.   . vitamin C (ASCORBIC ACID) 500 MG tablet Take 500 mg by mouth daily.     Allergies:   Levofloxacin and Statins   Social History   Tobacco Use  . Smoking status: Former Smoker    Types: Cigarettes    Last attempt to quit: 05/15/1996    Years since quitting: 22.5  . Smokeless tobacco: Never Used  . Tobacco comment: quit 08/1996   Substance Use Topics  . Alcohol use: Yes    Alcohol/week: 2.0 standard drinks    Types: 2 Cans of beer per week    Comment: rare twice a week  . Drug use: No     Family Hx: The patient's family history includes Aneurysm in his mother; Cancer in his maternal grandfather and maternal grandmother; Epilepsy in his mother; Heart disease in his father; Hypertension in his mother.  ROS:   Please see the history of present illness.     All other systems reviewed and are negative.   Prior CV studies:   The following studies were reviewed today:  ETT 08/29/17 1.  The patient exercised for 9 minutes according to the [place] protocol, achieving 13.0 metabolic equivalents 2.  The patient achieved 100% of his maximal predicted heart rate 3.  There is upsloping ST depression resolving early in recovery, unchanged from previous studies Overall interpretation is low risk  ETT 08/01/15  Upsloping ST segment depression ST segment depression was noted during stress in the II, III, aVF, V4, V5 and V6 leads, and returning to baseline after less than 1 minute of recovery.  Blood pressure demonstrated a hypertensive response to exercise.  Myoview 10/16/12 EF 56, no significant ischemia, diaph atten; Low Risk  Cardiac Catheterization 12/04/2008 EF > 55 LM normal LAD prox 30, prox-mid stent patent with 40 ISR; superior br of D1 50  Echo 11/17/06 EF > 55, mild MR, mild TR   Labs/Other Tests and Data Reviewed:    EKG:  No ECG reviewed.  Recent Labs: 02/22/2018: BUN 14; Creatinine, Ser 0.89; Hemoglobin 14.5; Platelets 161; Potassium 4.0; Sodium 139 04/26/2018: ALT 23   Recent Lipid Panel Lab Results  Component Value Date/Time   CHOL 123 04/26/2018 09:12 AM   TRIG 61 04/26/2018 09:12 AM   HDL 38 (L) 04/26/2018 09:12 AM   CHOLHDL 3.2 04/26/2018 09:12 AM   CHOLHDL 4.8 12/04/2008 07:11 AM   LDLCALC 73 04/26/2018 09:12 AM    Wt Readings from Last 3 Encounters:  11/17/18 240 lb (108.9 kg)   11/02/17 244 lb 3.2 oz (110.8 kg)  09/02/17 244 lb 12.8 oz (111 kg)     Objective:    Vital Signs:  BP 133/79   Pulse 60   Ht 6' 1.5" (1.867 m)   Wt 240 lb (108.9 kg)   BMI 31.23 kg/m    VITAL SIGNS:  reviewed GEN:  no acute distress RESPIRATORY:  No labored breathing NEURO:  Alert and oriented PSYCH:  He seems to be in good spirits  ASSESSMENT & PLAN:    Coronary artery disease involving native coronary artery of native heart without angina pectoris History of bare-metal stent to the LAD in 1998.  Cardiac catheterization 2010 demonstrated patent LAD stent.  Exercise treadmill test in favor 2019 was low risk.  He is doing well without anginal symptoms.  Continue aspirin, PCSK9 inhibitor.  Follow-up 1 year.  Essential hypertension The patient's blood pressure is controlled on his current regimen.  Continue current therapy.   Hyperlipidemia, unspecified hyperlipidemia type LDL optimal on most recent lab work.  Continue current Rx.    Educated About Covid-19 Virus Infection The signs and symptoms of COVID-19 were discussed with the patient and how to seek care for testing (follow up with PCP or arrange E-visit).  The importance of social distancing was discussed today.  Time:   Today, I have spent 8.5 minutes with the patient with telehealth technology discussing the above problems.     Medication Adjustments/Labs and Tests Ordered: Current medicines are reviewed at length with the patient today.  Concerns regarding medicines are outlined above.   Tests Ordered: No orders of the defined types were placed in this encounter.   Medication Changes: No orders of the defined types were placed in this encounter.   Disposition:  Follow up in 1 year(s)  Signed, Richardson Dopp, PA-C  11/17/2018 10:10 AM    [facility]

## 2018-11-17 ENCOUNTER — Encounter: Payer: Self-pay | Admitting: Physician Assistant

## 2018-11-17 ENCOUNTER — Other Ambulatory Visit: Payer: Self-pay

## 2018-11-17 ENCOUNTER — Telehealth (INDEPENDENT_AMBULATORY_CARE_PROVIDER_SITE_OTHER): Payer: Federal, State, Local not specified - PPO | Admitting: Physician Assistant

## 2018-11-17 VITALS — BP 133/79 | HR 60 | Ht 73.5 in | Wt 240.0 lb

## 2018-11-17 DIAGNOSIS — I2510 Atherosclerotic heart disease of native coronary artery without angina pectoris: Secondary | ICD-10-CM

## 2018-11-17 DIAGNOSIS — I10 Essential (primary) hypertension: Secondary | ICD-10-CM | POA: Diagnosis not present

## 2018-11-17 DIAGNOSIS — E785 Hyperlipidemia, unspecified: Secondary | ICD-10-CM

## 2018-11-17 DIAGNOSIS — Z7189 Other specified counseling: Secondary | ICD-10-CM

## 2018-11-17 NOTE — Patient Instructions (Signed)
Medication Instructions:  Continue current medications.  If you need a refill on your cardiac medications before your next appointment, please call your pharmacy.   Lab work: None  If you have labs (blood work) drawn today and your tests are completely normal, you will receive your results only by: Marland Kitchen MyChart Message (if you have MyChart) OR . A paper copy in the mail If you have any lab test that is abnormal or we need to change your treatment, we will call you to review the results.  Testing/Procedures: None  Follow-Up: At [facility], you and your health needs are our priority.  As part of our continuing mission to provide you with exceptional heart care, we have created designated Provider Care Teams.  These Care Teams include your primary Cardiologist (physician) and Advanced Practice Providers (APPs -  Physician Assistants and Nurse Practitioners) who all work together to provide you with the care you need, when you need it. You will need a follow up appointment in:  12 months.  Please call our office 2 months in advance to schedule this appointment.  You may see Sherren Mocha, MD or Richardson Dopp, PA-C   Any Other Special Instructions Will Be Listed Below (If Applicable).

## 2018-12-18 ENCOUNTER — Other Ambulatory Visit: Payer: Self-pay | Admitting: Pharmacist

## 2018-12-18 MED ORDER — REPATHA SURECLICK 140 MG/ML ~~LOC~~ SOAJ: 140.0000 mg | SUBCUTANEOUS | 11 refills | Status: DC

## 2019-01-26 ENCOUNTER — Other Ambulatory Visit (INDEPENDENT_AMBULATORY_CARE_PROVIDER_SITE_OTHER): Payer: Self-pay | Admitting: Internal Medicine

## 2019-01-26 DIAGNOSIS — K588 Other irritable bowel syndrome: Secondary | ICD-10-CM

## 2019-03-22 ENCOUNTER — Telehealth: Payer: Self-pay

## 2019-03-22 DIAGNOSIS — E785 Hyperlipidemia, unspecified: Secondary | ICD-10-CM

## 2019-03-22 NOTE — Telephone Encounter (Signed)
Called and lmomed the pt regarding labs for repatha so that the renewal pa can be submitted

## 2019-03-26 NOTE — Addendum Note (Signed)
Addended by: Allean Found on: 03/26/2019 10:41 AM   Modules accepted: Orders

## 2019-03-26 NOTE — Telephone Encounter (Signed)
° ° °  Patient plans to get labs on 9/23. Please enter order

## 2019-03-28 ENCOUNTER — Ambulatory Visit: Payer: Federal, State, Local not specified - PPO | Admitting: *Deleted

## 2019-03-28 ENCOUNTER — Other Ambulatory Visit: Payer: Federal, State, Local not specified - PPO

## 2019-03-28 ENCOUNTER — Other Ambulatory Visit: Payer: Self-pay

## 2019-03-28 DIAGNOSIS — E785 Hyperlipidemia, unspecified: Secondary | ICD-10-CM

## 2019-03-28 LAB — HEPATIC FUNCTION PANEL
ALT: 16 IU/L (ref 0–44)
AST: 16 IU/L (ref 0–40)
Albumin: 4.5 g/dL (ref 3.8–4.9)
Alkaline Phosphatase: 63 IU/L (ref 39–117)
Bilirubin Total: 0.6 mg/dL (ref 0.0–1.2)
Bilirubin, Direct: 0.22 mg/dL (ref 0.00–0.40)
Total Protein: 6.9 g/dL (ref 6.0–8.5)

## 2019-03-28 LAB — LIPID PANEL
Chol/HDL Ratio: 2.2 ratio (ref 0.0–5.0)
Cholesterol, Total: 93 mg/dL — ABNORMAL LOW (ref 100–199)
HDL: 42 mg/dL (ref >39)
LDL Chol Calc (NIH): 38 mg/dL (ref 0–99)
Triglycerides: 52 mg/dL (ref 0–149)
VLDL Cholesterol Cal: 13 mg/dL (ref 5–40)

## 2019-04-15 IMAGING — CT CT HEAD W/O CM
3 series · 15 of 47 positions shown, 18 images · non-contrast
Comparison: 09/26/2008 CT, 07/10/2016 CT head report

CLINICAL DATA: Gradual headache today worsening over the past few
hours.

EXAM:
CT HEAD WITHOUT CONTRAST
TECHNIQUE: Contiguous axial images were obtained from the base of the skull
through the vertex without intravenous contrast.

[Series 2: head trauma wo · axial · 0.47mm/px · z∈[+1558,+1688]mm · 9 of 32 slices shown, 12 images]
[im 3/32  brain]
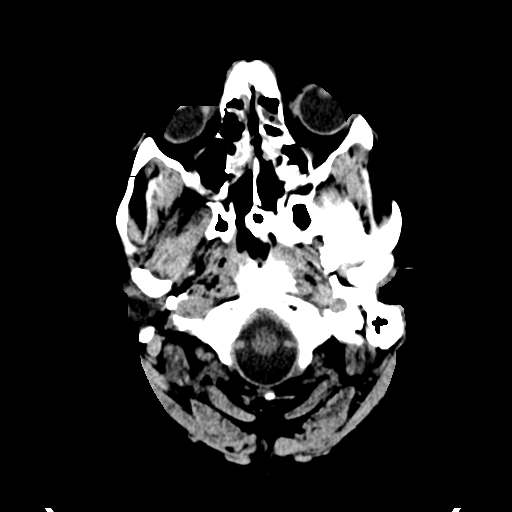
[im 3/32  bone]
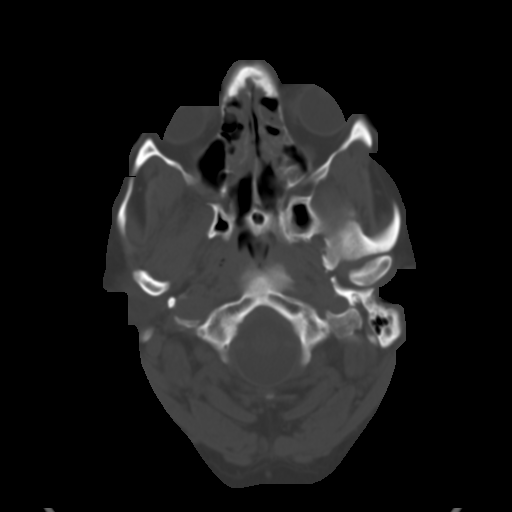
[im 6/32  brain]
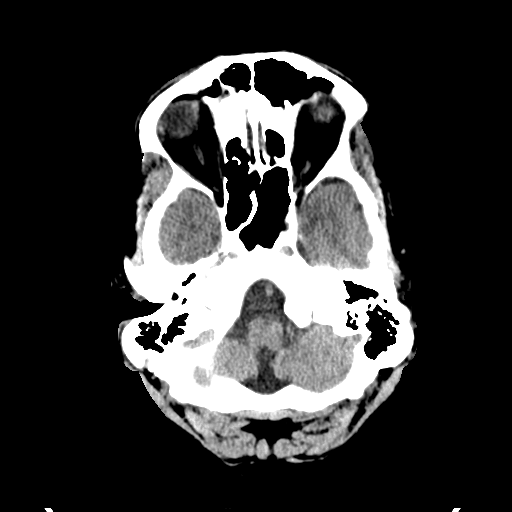
[im 9/32  brain]
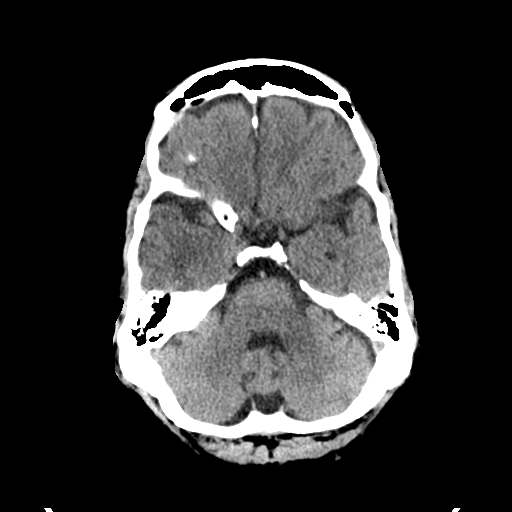
[im 12/32  brain]
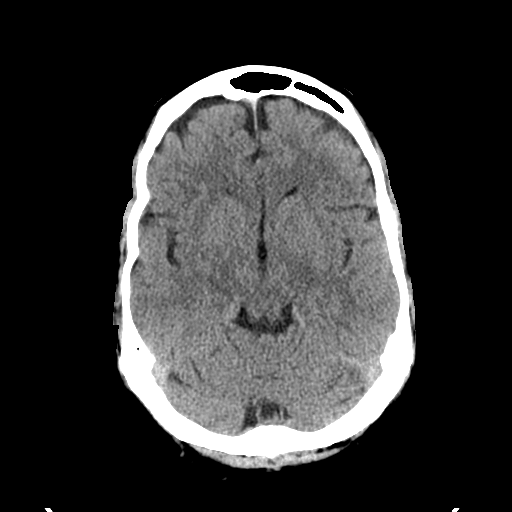
[im 17/32  brain]
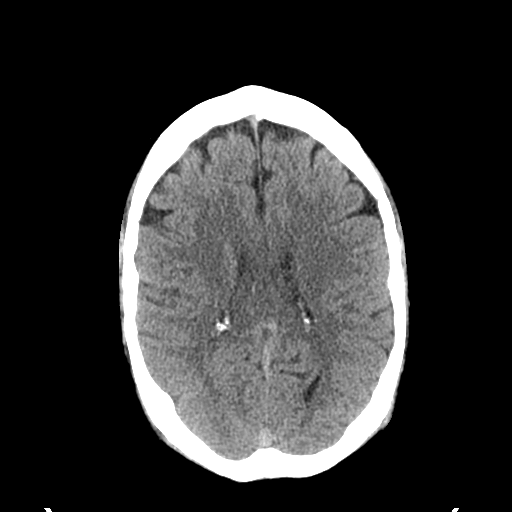
[im 17/32  bone]
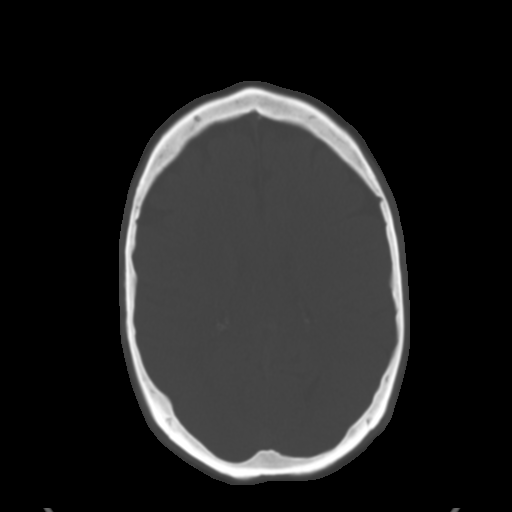
[im 20/32  brain]
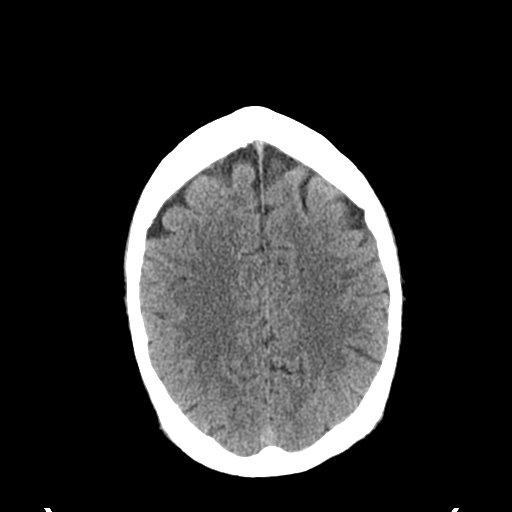
[im 23/32  brain]
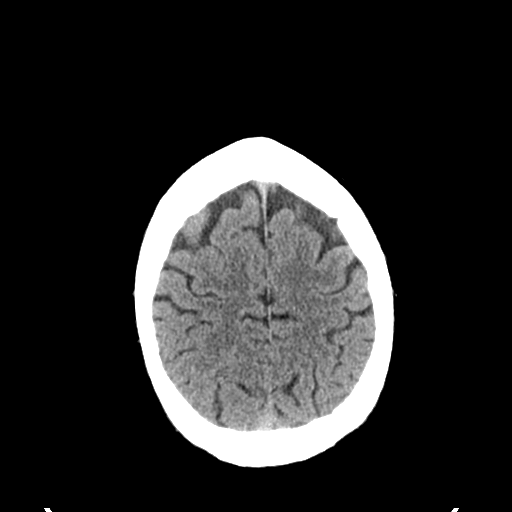
[im 26/32  brain]
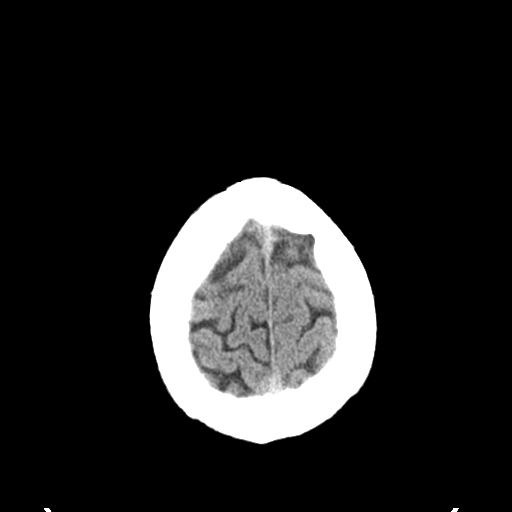
[im 29/32  brain]
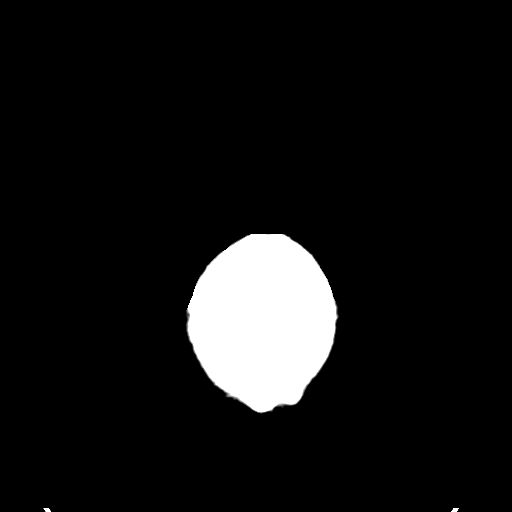
[im 29/32  bone]
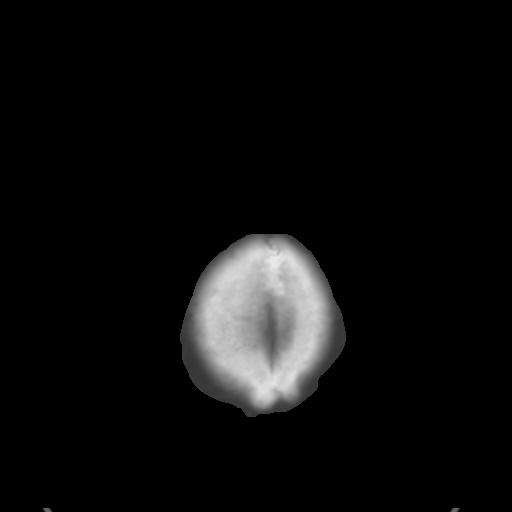

[Series 4: coronal soft tissue · coronal · 0.32mm/px · 3 of 73 slices shown]
[im 25/73  brain]
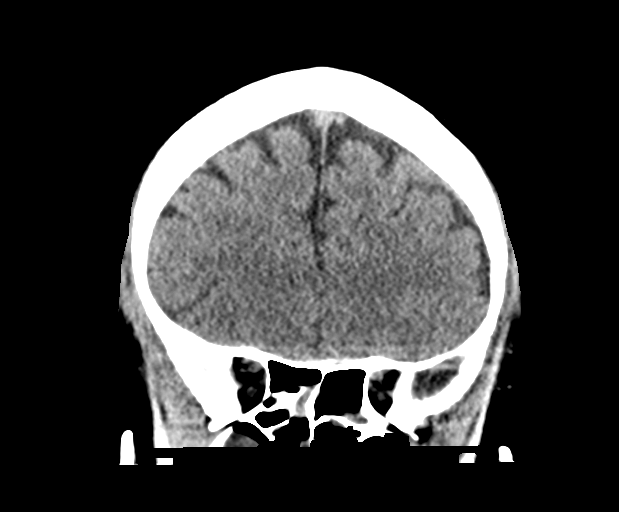
[im 33/73  brain]
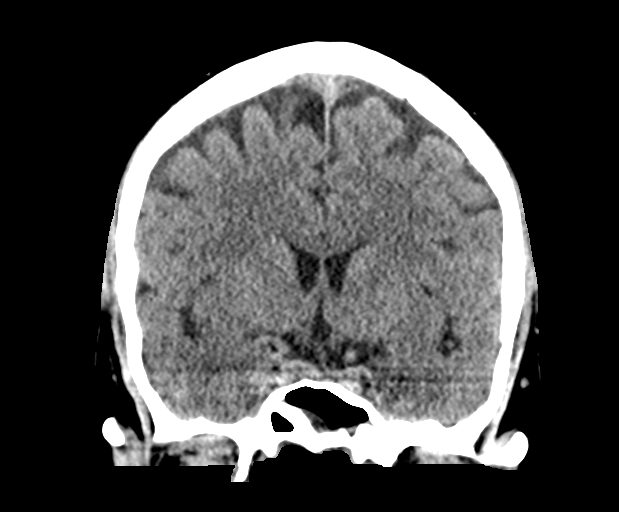
[im 41/73  brain]
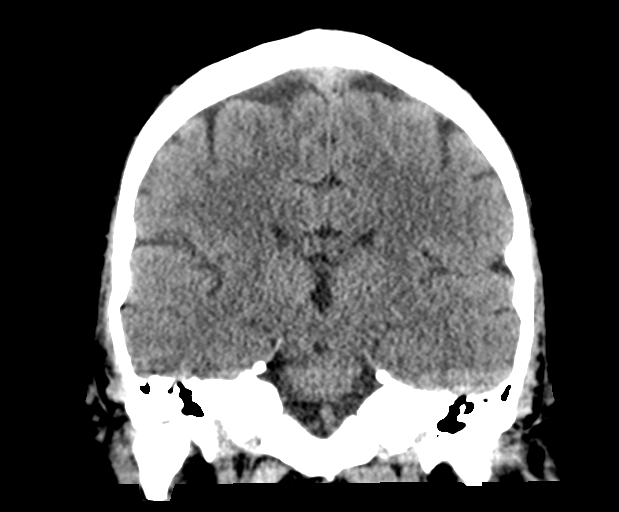

[Series 5: sagittal soft tissue · sagittal · 0.33mm/px · 3 of 51 slices shown]
[im 17/51  brain]
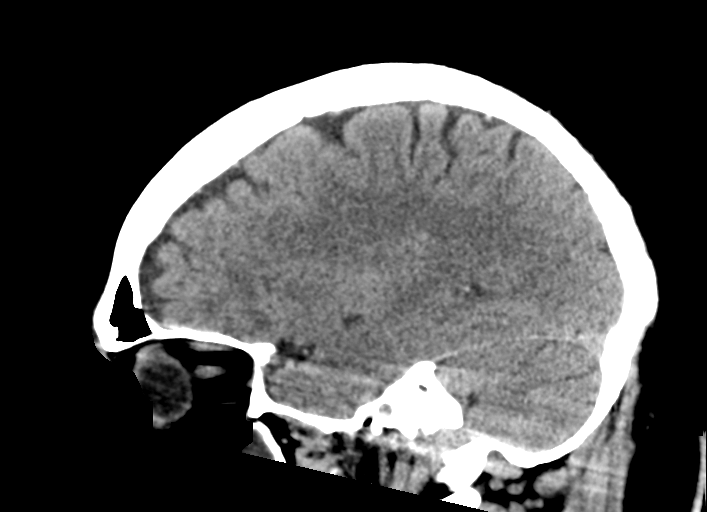
[im 26/51  brain]
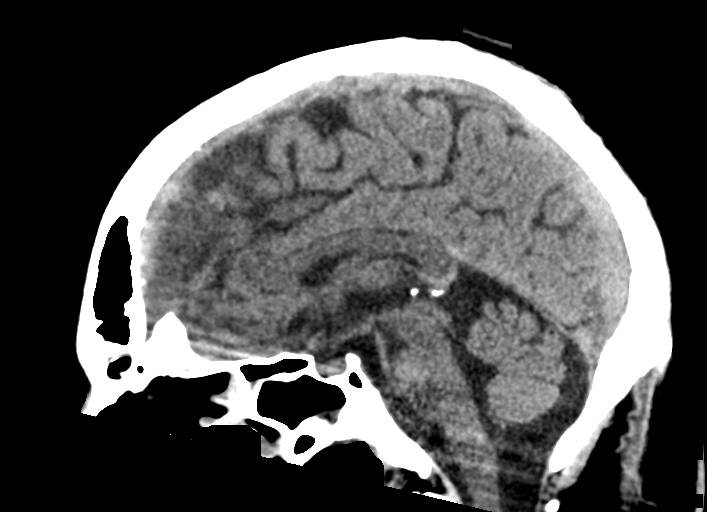
[im 34/51  brain]
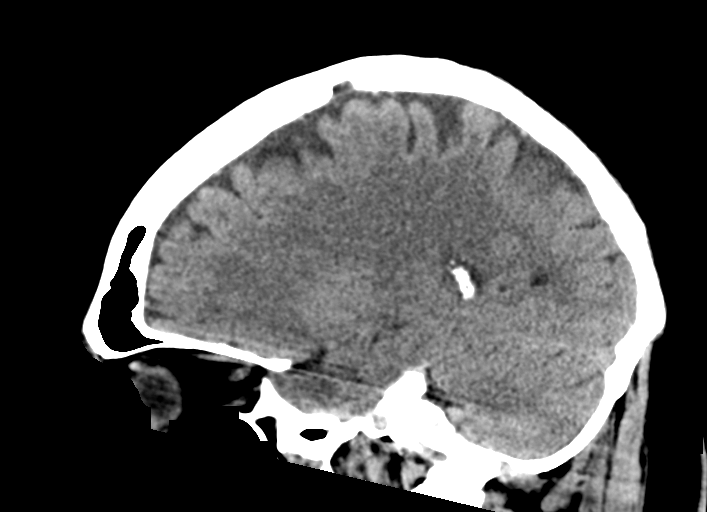

[15 of 47 positions shown; findings below may reference images not displayed]

FINDINGS: Brain: No evidence of acute infarction, hemorrhage, hydrocephalus,
extra-axial collection or mass lesion/mass effect. Mild superficial
atrophy of the cerebellum frontal lobes.

Vascular: No hyperdense vessel sign.

Skull: Negative for fracture or focal lesions.

Sinuses/Orbits: Moderate-to-marked paranasal sinus mucosal
thickening is noted of the ethmoid and right maxillary sinus to the
extent included. Small osteoma seen of the right frontal sinus. Mild
mucosal thickening is noted of the sphenoid sinus. Secretions are
redemonstrated in the frontal sinuses.

Other: None
IMPRESSION: No acute intracranial abnormality. Mild stable involutional changes
of brain.

Chronic sinusitis with mucosal thickening as above.

## 2019-07-23 DIAGNOSIS — J329 Chronic sinusitis, unspecified: Secondary | ICD-10-CM | POA: Diagnosis not present

## 2019-07-23 DIAGNOSIS — J301 Allergic rhinitis due to pollen: Secondary | ICD-10-CM | POA: Diagnosis not present

## 2019-07-24 DIAGNOSIS — J329 Chronic sinusitis, unspecified: Secondary | ICD-10-CM | POA: Diagnosis not present

## 2019-08-17 DIAGNOSIS — R310 Gross hematuria: Secondary | ICD-10-CM | POA: Diagnosis not present

## 2019-08-17 DIAGNOSIS — N529 Male erectile dysfunction, unspecified: Secondary | ICD-10-CM | POA: Diagnosis not present

## 2019-08-17 DIAGNOSIS — N138 Other obstructive and reflux uropathy: Secondary | ICD-10-CM | POA: Diagnosis not present

## 2019-08-17 DIAGNOSIS — N401 Enlarged prostate with lower urinary tract symptoms: Secondary | ICD-10-CM | POA: Diagnosis not present

## 2019-08-29 DIAGNOSIS — R310 Gross hematuria: Secondary | ICD-10-CM | POA: Diagnosis not present

## 2019-08-29 DIAGNOSIS — N132 Hydronephrosis with renal and ureteral calculous obstruction: Secondary | ICD-10-CM | POA: Diagnosis not present

## 2019-08-29 DIAGNOSIS — N289 Disorder of kidney and ureter, unspecified: Secondary | ICD-10-CM | POA: Diagnosis not present

## 2019-09-22 DIAGNOSIS — Z01818 Encounter for other preprocedural examination: Secondary | ICD-10-CM | POA: Diagnosis not present

## 2019-09-25 DIAGNOSIS — Z87891 Personal history of nicotine dependence: Secondary | ICD-10-CM | POA: Diagnosis not present

## 2019-09-25 DIAGNOSIS — R310 Gross hematuria: Secondary | ICD-10-CM | POA: Diagnosis not present

## 2019-09-25 DIAGNOSIS — N200 Calculus of kidney: Secondary | ICD-10-CM | POA: Diagnosis not present

## 2019-09-25 DIAGNOSIS — Z7982 Long term (current) use of aspirin: Secondary | ICD-10-CM | POA: Diagnosis not present

## 2019-09-25 DIAGNOSIS — R319 Hematuria, unspecified: Secondary | ICD-10-CM | POA: Diagnosis not present

## 2019-09-25 DIAGNOSIS — Z466 Encounter for fitting and adjustment of urinary device: Secondary | ICD-10-CM | POA: Diagnosis not present

## 2019-09-25 DIAGNOSIS — I2510 Atherosclerotic heart disease of native coronary artery without angina pectoris: Secondary | ICD-10-CM | POA: Diagnosis not present

## 2019-09-25 DIAGNOSIS — K219 Gastro-esophageal reflux disease without esophagitis: Secondary | ICD-10-CM | POA: Diagnosis not present

## 2019-09-25 DIAGNOSIS — E669 Obesity, unspecified: Secondary | ICD-10-CM | POA: Diagnosis not present

## 2019-09-25 DIAGNOSIS — Z6831 Body mass index (BMI) 31.0-31.9, adult: Secondary | ICD-10-CM | POA: Diagnosis not present

## 2019-09-25 DIAGNOSIS — I10 Essential (primary) hypertension: Secondary | ICD-10-CM | POA: Diagnosis not present

## 2019-09-25 DIAGNOSIS — E782 Mixed hyperlipidemia: Secondary | ICD-10-CM | POA: Diagnosis not present

## 2019-09-25 DIAGNOSIS — K589 Irritable bowel syndrome without diarrhea: Secondary | ICD-10-CM | POA: Diagnosis not present

## 2019-09-25 DIAGNOSIS — Z79899 Other long term (current) drug therapy: Secondary | ICD-10-CM | POA: Diagnosis not present

## 2019-09-25 DIAGNOSIS — L409 Psoriasis, unspecified: Secondary | ICD-10-CM | POA: Diagnosis not present

## 2019-10-08 DIAGNOSIS — Z466 Encounter for fitting and adjustment of urinary device: Secondary | ICD-10-CM | POA: Diagnosis not present

## 2019-10-08 DIAGNOSIS — N200 Calculus of kidney: Secondary | ICD-10-CM | POA: Diagnosis not present

## 2019-10-08 DIAGNOSIS — R310 Gross hematuria: Secondary | ICD-10-CM | POA: Diagnosis not present

## 2019-11-03 ENCOUNTER — Ambulatory Visit: Payer: Federal, State, Local not specified - PPO | Attending: Internal Medicine

## 2019-11-03 DIAGNOSIS — Z23 Encounter for immunization: Secondary | ICD-10-CM

## 2019-11-03 NOTE — Progress Notes (Signed)
   Covid-19 Vaccination Clinic  Name:  [name]    MRN: [id number] DOB: 10/18/1960  11/03/2019  [name] was observed post Covid-19 immunization for 15 minutes without incident. He was provided with Vaccine Information Sheet and instruction to access the V-Safe system.   [name] was instructed to call 911 with any severe reactions post vaccine: Marland Kitchen Difficulty breathing  . Swelling of face and throat  . A fast heartbeat  . A bad rash all over body  . Dizziness and weakness   Immunizations Administered    Name Date Dose VIS Date Route   Pfizer COVID-19 Vaccine 11/03/2019 10:10 AM 0.3 mL 08/29/2018 Intramuscular   Manufacturer: [place]   Lot: [id number]   [place]: [id number]

## 2019-11-05 ENCOUNTER — Ambulatory Visit (INDEPENDENT_AMBULATORY_CARE_PROVIDER_SITE_OTHER): Payer: Federal, State, Local not specified - PPO | Admitting: Nurse Practitioner

## 2019-11-20 ENCOUNTER — Other Ambulatory Visit: Payer: Self-pay | Admitting: *Deleted

## 2019-11-20 MED ORDER — REPATHA SURECLICK 140 MG/ML ~~LOC~~ SOAJ: 140.0000 mg | SUBCUTANEOUS | 11 refills | Status: DC

## 2019-11-22 ENCOUNTER — Ambulatory Visit (INDEPENDENT_AMBULATORY_CARE_PROVIDER_SITE_OTHER): Payer: Federal, State, Local not specified - PPO | Admitting: Gastroenterology

## 2019-11-26 ENCOUNTER — Ambulatory Visit: Payer: Federal, State, Local not specified - PPO | Attending: Internal Medicine

## 2019-11-26 DIAGNOSIS — Z23 Encounter for immunization: Secondary | ICD-10-CM

## 2019-11-26 NOTE — Progress Notes (Signed)
   Covid-19 Vaccination Clinic  Name:  [name]    MRN: [id number] DOB: [birth year]  11/26/2019  [name] was observed post Covid-19 immunization for 15 minutes without incident. He was provided with Vaccine Information Sheet and instruction to access the V-Safe system.   [name] was instructed to call 911 with any severe reactions post vaccine: Marland Kitchen Difficulty breathing  . Swelling of face and throat  . A fast heartbeat  . A bad rash all over body  . Dizziness and weakness   Immunizations Administered    Name Date Dose VIS Date Route   Pfizer COVID-19 Vaccine 11/26/2019  8:15 AM 0.3 mL 08/29/2018 Intramuscular   Manufacturer: [employer], [employer]   Lot: [id number]   [place]: [id number]

## 2019-12-06 NOTE — Progress Notes (Signed)
Cardiology Office Note:    Date:  12/07/2019   ID:  [name], DOB [birth year], MRN [id number]  PCP:  Vicenta Aly, FNP  Cardiologist:  Sherren Mocha, MD   Electrophysiologist:  None   Referring MD: Vicenta Aly, FNP   Chief Complaint:  Follow-up (CAD)    Patient Profile:    [name] is a 59 y.o. male with:   Coronary artery disease   S/p BMS to LAD in 1998  Cath in 2010: patent LAD stent  ETT in 2019 low risk  Hypertension   Hyperlipidemia   Intol to statins >> Repatha   Prior CV studies: ETT 08/29/17 1. The patient exercised for 9 minutes according to the Bruce protocol, achieving [id number] metabolic equivalents 2. The patient achieved 100% of his maximal predicted heart rate 3. There is upsloping ST depression resolving early in recovery, unchanged from previous studies Overall interpretation is low risk  ETT 08/01/15  Upsloping ST segment depression ST segment depression was noted during stress in the II, III, aVF, V4, V5 and V6 leads, and returning to baseline after less than 1 minute of recovery.  Blood pressure demonstrated a hypertensive response to exercise.  Myoview 10/16/12 EF 56, no significant ischemia, diaph atten; Low Risk  Cardiac Catheterization 12/04/2008 EF > 55 LM normal LAD prox 30, prox-mid stent patent with 40 ISR; superior br of D1 50  Echo 11/17/06 EF > 55, mild MR, mild TR  History of Present Illness:    Mr. [name] was last seen in 11/2018 via Telemedicine.  He returns for follow-up.  He has been doing well.  He has received both COVID-19 vaccines.  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  He has not had chest pain, shortness of breath, syncope, orthopnea, leg swelling.  He has gained 15 lbs and his diastolic BP is higher than it usually runs.  He is trying to lose weight now.  he snores but has not had daytime hypersomnolence or witnessed apnea.       Past Medical History:  Diagnosis Date   BPH (benign  prostatic hyperplasia)    Coronary artery disease  08/01/2013   BMS to LAD in 1998 // [place] in 2010 with patent stent // ETT 08/2017 Low Risk   Hyperlipidemia    intol of statins // Evolocumab Rx    Hypertension     Current Medications: Current Meds  Medication Sig   alprostadil (EDEX) 20 MCG injection 20 mcg by Intracavitary route as directed. use no more than 3 times per week   aspirin 81 MG tablet Take 81 mg by mouth daily.   Aspirin-Acetaminophen-Caffeine (EXCEDRIN EXTRA STRENGTH PO) Take 1 tablet by mouth 2 (two) times daily as needed (for pain).   augmented betamethasone dipropionate (DIPROLENE-AF) 0.05 % cream Apply 1 application topically 2 (two) times daily.    azelastine (ASTELIN) 0.1 % nasal spray Place 1 spray into both nostrils daily as needed for allergies. Use in each nostril as directed    Evolocumab (REPATHA SURECLICK) [id number] MG/ML SOAJ Inject 140 mg into the skin every 14 (fourteen) days.   montelukast (SINGULAIR) 10 MG tablet Take 10 mg by mouth at bedtime.   naproxen sodium (ANAPROX) 220 MG tablet Take 220 mg by mouth daily as needed (pain).    olmesartan (BENICAR) 20 MG tablet Take 10 mg by mouth every evening.    sucralfate (CARAFATE) 1 g tablet TAKE 1 TABLET(1 GRAM) BY MOUTH TWICE DAILY   tamsulosin (  FLOMAX) 0.4 MG CAPS Take 0.4 mg by mouth every evening.    vitamin C (ASCORBIC ACID) 500 MG tablet Take 500 mg by mouth daily.     Allergies:   Levofloxacin and Statins   Social History   Tobacco Use   Smoking status: Former Smoker    Types: Cigarettes    Quit date: 05/15/1996    Years since quitting: 23.5   Smokeless tobacco: Never Used   Tobacco comment: quit 08/1996  Substance Use Topics   Alcohol use: Yes    Alcohol/week: 2.0 standard drinks    Types: 2 Cans of beer per week    Comment: rare twice a week   Drug use: No     Family Hx: The patient's family history includes Aneurysm in his mother; Cancer in his maternal grandfather and  maternal grandmother; Epilepsy in his mother; Heart disease in his father; Hypertension in his mother.  Review of Systems  Cardiovascular: Negative for claudication.  Gastrointestinal: Negative for hematochezia and melena.  Genitourinary: Negative for hematuria.     EKGs/Labs/Other Test Reviewed:    EKG:  EKG is   ordered today.  The ekg ordered today demonstrates normal sinus rhythm, heart rate 65, normal axis, no ST-T wave changes, no change from prior tracing, QTC 418  Recent Labs: 03/28/2019: ALT 16   Recent Lipid Panel Lab Results  Component Value Date/Time   CHOL 93 (L) 03/28/2019 12:00 AM   TRIG 52 03/28/2019 12:00 AM   HDL 42 03/28/2019 12:00 AM   CHOLHDL 2.2 03/28/2019 12:00 AM   CHOLHDL 4.8 12/04/2008 07:11 AM   LDLCALC 38 03/28/2019 12:00 AM    Physical Exam:    VS:  BP 124/86    Pulse 65    Ht 6\' 2"  (1.88 m)    Wt 250 lb 3.2 oz (113.5 kg)    BMI 32.12 kg/m     Wt Readings from Last 3 Encounters:  12/07/19 250 lb 3.2 oz (113.5 kg)  11/17/18 240 lb (108.9 kg)  11/02/17 244 lb 3.2 oz (110.8 kg)     Constitutional:      Appearance: Healthy appearance. Not in distress.  Neck:     Thyroid: No thyromegaly.     Vascular: No carotid bruit. JVD normal.  Pulmonary:     Effort: Pulmonary effort is normal.     Breath sounds: No wheezing. No rales.  Cardiovascular:     Normal rate. Regular rhythm. Normal S1. Normal S2.     Murmurs: There is no murmur.  Pulses:    Intact distal pulses.  Edema:    Peripheral edema absent.  Abdominal:     Palpations: Abdomen is soft. There is no hepatomegaly.  Skin:    General: Skin is warm and dry.  Neurological:     General: No focal deficit present.     Mental Status: Alert and oriented to person, place and time.     Cranial Nerves: Cranial nerves are intact.       ASSESSMENT & PLAN:    1. Coronary artery disease involving native coronary artery of native heart without angina pectoris History of bare-metal stent to the  LAD in 1998.  Cardiac catheterization in 2010 demonstrated patent LAD stent.   Exercise treadmill test in 2019 was low risk. He remains very active without anginal symptoms. Continue aspirin, evolocumab, olmesartan. Follow-up 1 year.  2. Essential hypertension The patient's blood pressure is controlled on his current regimen.  Continue current therapy. He notes that his  diastolic number is somewhat higher but plans to lose the 15 pounds he recently gained.  3. Hyperlipidemia, unspecified hyperlipidemia type LDL optimal on most recent lab work.  Continue current Rx.     Dispo:  Return in about 1 year (around 12/06/2020) for Routine Follow Up, w/ Dr. Burt Knack, or Richardson Dopp, PA-C, in person.   Medication Adjustments/Labs and Tests Ordered: Current medicines are reviewed at length with the patient today.  Concerns regarding medicines are outlined above.  Tests Ordered: Orders Placed This Encounter  Procedures   EKG 12-Lead   Medication Changes: No orders of the defined types were placed in this encounter.   Signed, Richardson Dopp, PA-C  12/07/2019 8:41 AM    [facility] [place], [place], [place]  [address] Phone: [phone]; Fax: [phone]

## 2019-12-07 ENCOUNTER — Encounter: Payer: Self-pay | Admitting: Physician Assistant

## 2019-12-07 ENCOUNTER — Ambulatory Visit: Payer: Federal, State, Local not specified - PPO | Admitting: Physician Assistant

## 2019-12-07 ENCOUNTER — Other Ambulatory Visit: Payer: Self-pay

## 2019-12-07 VITALS — BP 124/86 | HR 65 | Ht 74.0 in | Wt 250.2 lb

## 2019-12-07 DIAGNOSIS — I10 Essential (primary) hypertension: Secondary | ICD-10-CM | POA: Diagnosis not present

## 2019-12-07 DIAGNOSIS — I2510 Atherosclerotic heart disease of native coronary artery without angina pectoris: Secondary | ICD-10-CM | POA: Diagnosis not present

## 2019-12-07 DIAGNOSIS — Z79899 Other long term (current) drug therapy: Secondary | ICD-10-CM | POA: Diagnosis not present

## 2019-12-07 DIAGNOSIS — E785 Hyperlipidemia, unspecified: Secondary | ICD-10-CM | POA: Diagnosis not present

## 2019-12-07 DIAGNOSIS — R7301 Impaired fasting glucose: Secondary | ICD-10-CM | POA: Diagnosis not present

## 2019-12-07 DIAGNOSIS — Z125 Encounter for screening for malignant neoplasm of prostate: Secondary | ICD-10-CM | POA: Diagnosis not present

## 2019-12-07 DIAGNOSIS — E782 Mixed hyperlipidemia: Secondary | ICD-10-CM | POA: Diagnosis not present

## 2019-12-07 DIAGNOSIS — Z0000 Encounter for general adult medical examination without abnormal findings: Secondary | ICD-10-CM | POA: Diagnosis not present

## 2019-12-07 NOTE — Patient Instructions (Signed)
Medication Instructions:  Your physician recommends that you continue on your current medications as directed. Please refer to the Current Medication list given to you today.  *If you need a refill on your cardiac medications before your next appointment, please call your pharmacy*   Lab Work: None If you have labs (blood work) drawn today and your tests are completely normal, you will receive your results only by: Marland Kitchen MyChart Message (if you have MyChart) OR . A paper copy in the mail If you have any lab test that is abnormal or we need to change your treatment, we will call you to review the results.   Testing/Procedures: None   Follow-Up: At [facility], you and your health needs are our priority.  As part of our continuing mission to provide you with exceptional heart care, we have created designated Provider Care Teams.  These Care Teams include your primary Cardiologist (physician) and Advanced Practice Providers (APPs -  Physician Assistants and Nurse Practitioners) who all work together to provide you with the care you need, when you need it.  We recommend signing up for the patient portal called "MyChart".  Sign up information is provided on this After Visit Summary.  MyChart is used to connect with patients for Virtual Visits (Telemedicine).  Patients are able to view lab/test results, encounter notes, upcoming appointments, etc.  Non-urgent messages can be sent to your provider as well.   To learn more about what you can do with MyChart, go to [email or web address].    Your next appointment:   12 month(s)  The format for your next appointment:   In Person  Provider:   You may see Sherren Mocha, MD or one of the following Advanced Practice Providers on your designated Care Team:    Richardson Dopp, PA-C  Robbie Lis, [place]    Other Instructions None

## 2020-01-02 ENCOUNTER — Other Ambulatory Visit: Payer: Self-pay

## 2020-01-02 ENCOUNTER — Ambulatory Visit (INDEPENDENT_AMBULATORY_CARE_PROVIDER_SITE_OTHER): Payer: Self-pay

## 2020-01-02 ENCOUNTER — Encounter (INDEPENDENT_AMBULATORY_CARE_PROVIDER_SITE_OTHER): Payer: Self-pay | Admitting: Gastroenterology

## 2020-01-02 ENCOUNTER — Telehealth (INDEPENDENT_AMBULATORY_CARE_PROVIDER_SITE_OTHER): Payer: Self-pay | Admitting: *Deleted

## 2020-01-02 ENCOUNTER — Ambulatory Visit (INDEPENDENT_AMBULATORY_CARE_PROVIDER_SITE_OTHER): Payer: Federal, State, Local not specified - PPO | Admitting: Gastroenterology

## 2020-01-02 VITALS — BP 117/76 | HR 60 | Temp 98.6°F | Ht 73.5 in | Wt 248.5 lb

## 2020-01-02 DIAGNOSIS — K588 Other irritable bowel syndrome: Secondary | ICD-10-CM

## 2020-01-02 DIAGNOSIS — K219 Gastro-esophageal reflux disease without esophagitis: Secondary | ICD-10-CM

## 2020-01-02 MED ORDER — SUCRALFATE 1 G PO TABS: 1.0000 g | ORAL_TABLET | Freq: Two times a day (BID) | ORAL | 3 refills | Status: DC

## 2020-01-02 NOTE — Progress Notes (Signed)
Patient profile: [name] is a 59 y.o. male seen for follow-up of GERD and IBS.  History of Present Illness: [name] is seen today for GERD.  He reports he is doing very well on Carafate twice a day. If he decreases to once a day he does not have significant esophageal burning but does feel his bowels become irregular.  They are very regular on Carafate twice daily.  He is usually having bowel movement daily without any constipation, diarrhea, melena, rectal bleeding.  No abdominal pain.  No nausea vomiting or GERD.  He has occasional limited NSAID use.  He stays very active at his job at the [employer] but hopes to retire next year.  Wt Readings from Last 3 Encounters:  01/02/20 248 lb 8 oz (112.7 kg)  12/07/19 250 lb 3.2 oz (113.5 kg)  11/17/18 240 lb (108.9 kg)     Last Colonoscopy: 2010--hyperplastic polyp    Last Endoscopy: 04/12/2013 EGD: left upper quadrant pain, GERD; Impression: Focal erythema distal esophagus suggestive of healing erosions. Few small hyperplastic polyps in proximal stomach. These were left alone. Pyloric channel inflammation without ulcer or stenosis.    Past Medical History:  Past Medical History:  Diagnosis Date  . BPH (benign prostatic hyperplasia)   . Coronary artery disease  08/01/2013   BMS to LAD in 1998 // [place] in 2010 with patent stent // ETT 08/2017 Low Risk  . Hyperlipidemia    intol of statins // Evolocumab Rx   . Hypertension     Problem List: Patient Active Problem List   Diagnosis Date Noted  . Hyperlipidemia 12/31/2016  . Coronary artery disease  08/01/2013  . GERD (gastroesophageal reflux disease) 03/27/2013  . Abdominal pain, left upper quadrant 03/27/2013  . Chest pain 09/08/2012  . HTN (hypertension) 09/08/2012    Past Surgical History: Past Surgical History:  Procedure Laterality Date  . APPENDECTOMY    . CHOLECYSTECTOMY    . CORONARY ANGIOPLASTY WITH STENT PLACEMENT     November 1998  .  ESOPHAGOGASTRODUODENOSCOPY N/A 04/12/2013   Procedure: ESOPHAGOGASTRODUODENOSCOPY (EGD);  Surgeon: Rogene Houston, MD;  [facility];  Service: Endoscopy;  Laterality: N/A;  325-moved to 130 Melanie notified pt  . VASECTOMY      Allergies: Allergies  Allergen Reactions  . Levofloxacin Palpitations  . Statins Other (See Comments)    Muscles hurt. Muscles hurt.      Home Medications:  Current Outpatient Medications:  .  alprostadil (EDEX) 20 MCG injection, 20 mcg by Intracavitary route as directed. use no more than 3 times per week, Disp: , Rfl:  .  aspirin 81 MG tablet, Take 81 mg by mouth daily., Disp: , Rfl:  .  Aspirin-Acetaminophen-Caffeine (EXCEDRIN EXTRA STRENGTH PO), Take 1 tablet by mouth 2 (two) times daily as needed (for pain)., Disp: , Rfl:  .  augmented betamethasone dipropionate (DIPROLENE-AF) 0.05 % cream, Apply 1 application topically 2 (two) times daily. , Disp: , Rfl:  .  azelastine (ASTELIN) 0.1 % nasal spray, Place 1 spray into both nostrils daily as needed for allergies. Use in each nostril as directed , Disp: , Rfl:  .  Evolocumab (REPATHA SURECLICK) 295 MG/ML SOAJ, Inject 140 mg into the skin every 14 (fourteen) days., Disp: 2 pen, Rfl: 11 .  montelukast (SINGULAIR) 10 MG tablet, Take 10 mg by mouth at bedtime., Disp: , Rfl:  .  naproxen sodium (ANAPROX) 220 MG tablet, Take 220 mg by mouth daily as  needed (pain). , Disp: , Rfl:  .  olmesartan (BENICAR) 20 MG tablet, Take 10 mg by mouth every evening. , Disp: , Rfl:  .  sucralfate (CARAFATE) 1 g tablet, Take 1 tablet (1 g total) by mouth 2 (two) times daily., Disp: 180 tablet, Rfl: 3 .  tamsulosin (FLOMAX) 0.4 MG CAPS, Take 0.4 mg by mouth every evening. , Disp: , Rfl:  .  vitamin C (ASCORBIC ACID) 500 MG tablet, Take 500 mg by mouth daily., Disp: , Rfl:    Family History: family history includes Aneurysm in his mother; Cancer in his maternal grandfather and maternal grandmother; Epilepsy in his mother;  Heart disease in his father; Hypertension in his mother.    Social History:   reports that he quit smoking about 23 years ago. His smoking use included cigarettes. He has never used smokeless tobacco. He reports current alcohol use of about 2.0 standard drinks of alcohol per week. He reports that he does not use drugs.   Review of Systems: Constitutional: Denies weight loss/weight gain  Eyes: No changes in vision. ENT: No oral lesions, sore throat.  GI: see HPI.  Heme/Lymph: No easy bruising.  CV: No chest pain.  GU: No hematuria.  Integumentary: No rashes.  Neuro: No headaches.  Psych: No depression/anxiety.  Endocrine: No heat/cold intolerance.  Allergic/Immunologic: No urticaria.  Resp: No cough, SOB.  Musculoskeletal: No joint swelling.    Physical Examination: BP 117/76 (BP Location: Right Arm, Patient Position: Sitting, Cuff Size: Large)   Pulse 60   Temp 98.6 F (37 C) (Oral)   Ht 6' 1.5" (1.867 m)   Wt 248 lb 8 oz (112.7 kg)   BMI 32.34 kg/m  Gen: NAD, alert and oriented x 4 HEENT: PEERLA, EOMI, Neck: supple, no JVD Chest: CTA bilaterally, no wheezes, crackles, or other adventitious sounds CV: RRR, no m/g/c/r Abd: soft, NT, ND, +BS in all four quadrants; no HSM, guarding, ridigity, or rebound tenderness Ext: no edema, well perfused with 2+ pulses, Skin: no rash or lesions noted on observed skin Lymph: no noted LAD  Data Reviewed:  Labs 12/07/19---CMP normal. CBC normal. A1c 5.6,    Assessment/Plan: [name] is a 59 y.o. male seen today for follow-up  1.  GERD-well-controlled on Carafate twice daily, he prefers to continue this regimen.  Diet modifications discussed.  No upper GI alarm symptoms.  2.  Colon cancer screening-due for colonoscopy, last 2010.  He would like to plan to do this next year once he is retired.  Reviewed GI alarm symptoms to contact me with in interim.   [name] was seen today for follow-up.  Diagnoses and all orders for this  visit:  Gastroesophageal reflux disease without esophagitis  Other irritable bowel syndrome -     sucralfate (CARAFATE) 1 g tablet; Take 1 tablet (1 g total) by mouth 2 (two) times daily.        I personally performed the service, non-incident to. (WP)  Laurine Blazer, [facility]

## 2020-01-02 NOTE — Telephone Encounter (Signed)
   Diagnosis:    Result(s)   Card 1:Negative:        Completed by:  Marthe Patch , LPN   HEMOCCULT SENSA DEVELOPER: LOT#:  [id number] EXPIRATION DATE: 2021-11   HEMOCCULT SENSA CARD:  [id number] 2 L :   EXPIRATION DATE: 05/21   CARD CONTROL RESULTS:  POSITIVE: Postitve NEGATIVE: Negative    ADDITIONAL COMMENTS: Laurine Blazer PA C made aware as the patient is here for his office visit.

## 2020-01-02 NOTE — Patient Instructions (Signed)
Continue carafate twice a day. Please call sooner than 1 year if would like to schedule screening colonoscopy

## 2020-04-17 ENCOUNTER — Telehealth: Payer: Self-pay | Admitting: Cardiovascular Disease

## 2020-04-17 NOTE — Telephone Encounter (Signed)
[facility] calling to get prior authorization on   Evolocumab (REPATHA SURECLICK) 886 MG/ML SOAJ   Prior Authorization phone number is [phone] option 2

## 2020-04-17 NOTE — Telephone Encounter (Signed)
Pa submitted on cmm

## 2020-04-18 NOTE — Telephone Encounter (Signed)
Additional info faxed per request

## 2020-04-29 MED ORDER — REPATHA SURECLICK 140 MG/ML ~~LOC~~ SOAJ: 140.0000 mg | SUBCUTANEOUS | 11 refills | Status: DC

## 2020-04-29 NOTE — Addendum Note (Signed)
Addended by: Genevieve Arbaugh E on: 04/29/2020 09:07 AM   Modules accepted: Orders

## 2020-04-29 NOTE — Telephone Encounter (Signed)
Appeals approved for Repatha through 04/24/21, new refill sent in.

## 2020-05-07 DIAGNOSIS — N200 Calculus of kidney: Secondary | ICD-10-CM | POA: Diagnosis not present

## 2020-05-07 DIAGNOSIS — R319 Hematuria, unspecified: Secondary | ICD-10-CM | POA: Diagnosis not present

## 2020-07-25 ENCOUNTER — Other Ambulatory Visit: Payer: Self-pay | Admitting: Cardiovascular Disease

## 2020-07-28 MED ORDER — REPATHA SURECLICK 140 MG/ML ~~LOC~~ SOAJ: SUBCUTANEOUS | 11 refills | Status: DC

## 2020-07-28 NOTE — Addendum Note (Signed)
Addended by: Sandi Towe E on: 07/28/2020 12:21 PM   Modules accepted: Orders

## 2020-07-28 NOTE — Addendum Note (Signed)
Addended by: Sola Margolis E on: 07/28/2020 01:22 PM   Modules accepted: Orders

## 2020-07-29 DIAGNOSIS — N401 Enlarged prostate with lower urinary tract symptoms: Secondary | ICD-10-CM | POA: Diagnosis not present

## 2020-07-29 DIAGNOSIS — N138 Other obstructive and reflux uropathy: Secondary | ICD-10-CM | POA: Diagnosis not present

## 2020-07-29 DIAGNOSIS — F5221 Male erectile disorder: Secondary | ICD-10-CM | POA: Diagnosis not present

## 2020-07-29 DIAGNOSIS — R310 Gross hematuria: Secondary | ICD-10-CM | POA: Diagnosis not present

## 2020-09-30 DIAGNOSIS — R310 Gross hematuria: Secondary | ICD-10-CM | POA: Diagnosis not present

## 2020-09-30 DIAGNOSIS — N138 Other obstructive and reflux uropathy: Secondary | ICD-10-CM | POA: Diagnosis not present

## 2020-09-30 DIAGNOSIS — N401 Enlarged prostate with lower urinary tract symptoms: Secondary | ICD-10-CM | POA: Diagnosis not present

## 2021-01-01 ENCOUNTER — Ambulatory Visit (INDEPENDENT_AMBULATORY_CARE_PROVIDER_SITE_OTHER): Payer: Federal, State, Local not specified - PPO | Admitting: Gastroenterology

## 2021-04-07 ENCOUNTER — Other Ambulatory Visit (INDEPENDENT_AMBULATORY_CARE_PROVIDER_SITE_OTHER): Payer: Self-pay

## 2021-04-07 DIAGNOSIS — K588 Other irritable bowel syndrome: Secondary | ICD-10-CM

## 2021-04-07 MED ORDER — SUCRALFATE 1 G PO TABS: 1.0000 g | ORAL_TABLET | Freq: Two times a day (BID) | ORAL | 0 refills | Status: DC

## 2021-04-30 ENCOUNTER — Ambulatory Visit (INDEPENDENT_AMBULATORY_CARE_PROVIDER_SITE_OTHER): Payer: Federal, State, Local not specified - PPO | Admitting: Gastroenterology

## 2021-04-30 ENCOUNTER — Encounter (INDEPENDENT_AMBULATORY_CARE_PROVIDER_SITE_OTHER): Payer: Self-pay | Admitting: Gastroenterology

## 2021-04-30 ENCOUNTER — Other Ambulatory Visit: Payer: Self-pay

## 2021-04-30 VITALS — BP 142/84 | HR 76 | Temp 98.2°F | Ht 73.5 in | Wt 243.8 lb

## 2021-04-30 DIAGNOSIS — K589 Irritable bowel syndrome without diarrhea: Secondary | ICD-10-CM | POA: Diagnosis not present

## 2021-04-30 DIAGNOSIS — K219 Gastro-esophageal reflux disease without esophagitis: Secondary | ICD-10-CM

## 2021-04-30 NOTE — Progress Notes (Signed)
Maylon Peppers, M.D. Gastroenterology & Hepatology [facility] [address] [place], [place] [address]  Primary Care Physician: Vicenta Aly, [place] [address]  I will communicate my assessment and recommendations to the referring MD via EMR.  Problems: Irritable bowel syndrome GERD  History of Present Illness: [name] is a 60 y.o. male with Pmh IBS, GERD, who presents for follow up of irritable bowel syndrome and GERD.  The patient was last seen on 01/02/2020. At that time, the patient was counseled to continue taking sucralfate twice a day for management of GERD and IBS.  Patient reports that he has not been taking any Prilosec for the last two years. He states that he does not have any heartburn, dysphagia or odynophagia. He states that occasionally he has episodes of abdominal pain in his left side, which he has been treating with sucralfate twice a day. Takes Aleve very occasionally if his pain is significant but this is very infrequent. Most of the days he one bowel movement but sometimes he skips a day or some days he has 2-3 loose bowel movements.  No fecal soiling, nausea, vomiting, fever, chills, hematochezia, melena, hematemesis, abdominal distention, jaundice, pruritus or weight loss.  Overall, he feels well.  Reports he retired yesterday from his job.  He is excited to start a new chapter in his life.  Last EGD: 2014 - Focal erythema distal esophagus suggestive of healing erosions. Few small hyperplastic polyps in proximal stomach. These were left alone. Pyloric channel inflammation without ulcer or stenosis. Last Colonoscopy: 2010--hyperplastic polyp   Past Medical History: Past Medical History:  Diagnosis Date   BPH (benign prostatic hyperplasia)    Coronary artery disease  08/01/2013   BMS to LAD in 1998 // [place] in 2010 with patent stent // ETT 08/2017 Low Risk    Hyperlipidemia    intol of statins // Evolocumab Rx    Hypertension     Past Surgical History: Past Surgical History:  Procedure Laterality Date   APPENDECTOMY     CHOLECYSTECTOMY     CORONARY ANGIOPLASTY WITH STENT PLACEMENT     November 1998   ESOPHAGOGASTRODUODENOSCOPY N/A 04/12/2013   Procedure: ESOPHAGOGASTRODUODENOSCOPY (EGD);  Surgeon: Rogene Houston, MD;  Location[facility];  Service: Endoscopy;  Laterality: N/A;  325-moved to 130 Melanie notified pt   VASECTOMY      Family History: Family History  Problem Relation Age of Onset   Hypertension Mother    Epilepsy Mother    Aneurysm Mother    Heart disease Father    Cancer Maternal Grandmother    Cancer Maternal Grandfather     Social History: Social History   Tobacco Use  Smoking Status Former   Types: Cigarettes   Quit date: 05/15/1996   Years since quitting: 24.9  Smokeless Tobacco Never  Tobacco Comments   quit 08/1996   Social History   Substance and Sexual Activity  Alcohol Use Yes   Alcohol/week: 2.0 standard drinks   Types: 2 Cans of beer per week   Comment: rare twice a week   Social History   Substance and Sexual Activity  Drug Use No    Allergies: Allergies  Allergen Reactions   Levofloxacin Palpitations   Statins Other (See Comments)    Muscles hurt. Muscles hurt.    Medications: Current Outpatient Medications  Medication Sig Dispense Refill   alprostadil (EDEX) 20 MCG injection 20 mcg by  Intracavitary route as directed. use no more than 3 times per week     aspirin 81 MG tablet Take 81 mg by mouth daily.     Aspirin-Acetaminophen-Caffeine (EXCEDRIN EXTRA STRENGTH PO) Take 1 tablet by mouth 2 (two) times daily as needed (for pain).     augmented betamethasone dipropionate (DIPROLENE-AF) 0.05 % cream Apply 1 application topically 2 (two) times daily.      azelastine (ASTELIN) 0.1 % nasal spray Place 1 spray into both nostrils daily as needed for allergies. Use in each nostril  as directed      Evolocumab (REPATHA SURECLICK) 846 MG/ML SOAJ INJECT 1 PEN (140MG ) INTO THE SKIN EVERY 2 WEEKS. 2 mL 11   montelukast (SINGULAIR) 10 MG tablet Take 10 mg by mouth at bedtime.     naproxen sodium (ANAPROX) 220 MG tablet Take 220 mg by mouth daily as needed (pain).      olmesartan (BENICAR) 20 MG tablet Take 20 mg by mouth every evening.     sucralfate (CARAFATE) 1 g tablet Take 1 tablet (1 g total) by mouth 2 (two) times daily. 180 tablet 0   tamsulosin (FLOMAX) 0.4 MG CAPS Take 0.4 mg by mouth every evening.      vitamin C (ASCORBIC ACID) 500 MG tablet Take 500 mg by mouth daily.     No current facility-administered medications for this visit.    Review of Systems: GENERAL: negative for malaise, night sweats HEENT: No changes in hearing or vision, no nose bleeds or other nasal problems. NECK: Negative for lumps, goiter, pain and significant neck swelling RESPIRATORY: Negative for cough, wheezing CARDIOVASCULAR: Negative for chest pain, leg swelling, palpitations, orthopnea GI: SEE HPI MUSCULOSKELETAL: Negative for joint pain or swelling, back pain, and muscle pain. SKIN: Negative for lesions, rash PSYCH: Negative for sleep disturbance, mood disorder and recent psychosocial stressors. HEMATOLOGY Negative for prolonged bleeding, bruising easily, and swollen nodes. ENDOCRINE: Negative for cold or heat intolerance, polyuria, polydipsia and goiter. NEURO: negative for tremor, gait imbalance, syncope and seizures. The remainder of the review of systems is noncontributory.   Physical Exam: BP (!) 142/84 (BP Location: Left Arm, Patient Position: Sitting, Cuff Size: Large)   Pulse 76   Temp 98.2 F (36.8 C) (Oral)   Ht 6' 1.5" (1.867 m)   Wt 243 lb 12.8 oz (110.6 kg)   BMI 31.73 kg/m  GENERAL: The patient is AO x3, in no acute distress. HEENT: Head is normocephalic and atraumatic. EOMI are intact. Mouth is well hydrated and without lesions. NECK: Supple. No  masses LUNGS: Clear to auscultation. No presence of rhonchi/wheezing/rales. Adequate chest expansion HEART: RRR, normal s1 and s2. ABDOMEN: Soft, nontender, no guarding, no peritoneal signs, and nondistended. BS +. No masses. EXTREMITIES: Without any cyanosis, clubbing, rash, lesions or edema. NEUROLOGIC: AOx3, no focal motor deficit. SKIN: no jaundice, no rashes  Imaging/Labs: as above  I personally reviewed and interpreted the available labs, imaging and endoscopic files.  Impression and Plan: [name] is a 61 y.o. male with Pmh IBS, GERD, who presents for follow up of irritable bowel syndrome and GERD.  Patient has presented chronic history of IBS which has been managed with the use of Carafate twice a day.  He has not presented any red flag signs and overall his symptoms have been well controlled.  He can keep taking the Carafate twice a day or decrease to once a day if interested, no new changes will be done to his current medication regimen.  In terms of his GERD, he has not present any episodes of reflux while off PPI.  We will continue monitoring symptoms for now and if he has recurrent symptoms he will need to be started on low-dose PPI.  Finally, he is due for colorectal cancer screening but as he recently retired he wants to set up his insurance coverage before scheduling this test.  He will give [place] a call once this has been set up.  - Continue taking Carafate twice a day, can decrease to once a day if interested -Patient to call back to the office once his insurance has been set up to schedule screening colonoscopy  All questions were answered.      Harvel Quale, MD Gastroenterology and Hepatology [facility]

## 2021-04-30 NOTE — Patient Instructions (Signed)
Continue taking Carafate twice a day, can decrease to once a day if interested Please call to the office once you are interested in pursuing colonoscopy and you have set up your insurance coverage

## 2021-06-04 ENCOUNTER — Other Ambulatory Visit: Payer: Self-pay | Admitting: Cardiovascular Disease

## 2021-06-04 DIAGNOSIS — E785 Hyperlipidemia, unspecified: Secondary | ICD-10-CM

## 2021-07-04 ENCOUNTER — Other Ambulatory Visit (INDEPENDENT_AMBULATORY_CARE_PROVIDER_SITE_OTHER): Payer: Self-pay | Admitting: Gastroenterology

## 2021-07-04 DIAGNOSIS — K588 Other irritable bowel syndrome: Secondary | ICD-10-CM

## 2021-07-07 NOTE — Telephone Encounter (Signed)
Last seen 04/30/2021 for [name], By Dr. Jenetta Downer

## 2021-07-30 ENCOUNTER — Other Ambulatory Visit: Payer: Self-pay | Admitting: Cardiovascular Disease

## 2021-07-30 DIAGNOSIS — E785 Hyperlipidemia, unspecified: Secondary | ICD-10-CM

## 2021-08-28 ENCOUNTER — Ambulatory Visit: Payer: Federal, State, Local not specified - PPO | Admitting: Cardiovascular Disease

## 2021-08-28 ENCOUNTER — Other Ambulatory Visit: Payer: Self-pay

## 2021-08-28 ENCOUNTER — Encounter: Payer: Self-pay | Admitting: Cardiovascular Disease

## 2021-08-28 VITALS — BP 108/80 | HR 70 | Ht 73.5 in | Wt 256.2 lb

## 2021-08-28 DIAGNOSIS — E782 Mixed hyperlipidemia: Secondary | ICD-10-CM

## 2021-08-28 DIAGNOSIS — I10 Essential (primary) hypertension: Secondary | ICD-10-CM

## 2021-08-28 DIAGNOSIS — I2510 Atherosclerotic heart disease of native coronary artery without angina pectoris: Secondary | ICD-10-CM

## 2021-08-28 DIAGNOSIS — E785 Hyperlipidemia, unspecified: Secondary | ICD-10-CM

## 2021-08-28 MED ORDER — REPATHA SURECLICK 140 MG/ML ~~LOC~~ SOAJ: SUBCUTANEOUS | 11 refills | Status: DC

## 2021-08-28 NOTE — Patient Instructions (Signed)
Medication Instructions:  Repatha refill sent Your physician recommends that you continue on your current medications as directed. Please refer to the Current Medication list given to you today.  *If you need a refill on your cardiac medications before your next appointment, please call your pharmacy*   Lab Work: NONE If you have labs (blood work) drawn today and your tests are completely normal, you will receive your results only by: [place] (if you have MyChart) OR A paper copy in the mail If you have any lab test that is abnormal or we need to change your treatment, we will call you to review the results.   Testing/Procedures: NONE   Follow-Up: At [facility], you and your health needs are our priority.  As part of our continuing mission to provide you with exceptional heart care, we have created designated Provider Care Teams.  These Care Teams include your primary Cardiologist (physician) and Advanced Practice Providers (APPs -  Physician Assistants and Nurse Practitioners) who all work together to provide you with the care you need, when you need it.  We recommend signing up for the patient portal called "MyChart".  Sign up information is provided on this After Visit Summary.  MyChart is used to connect with patients for Virtual Visits (Telemedicine).  Patients are able to view lab/test results, encounter notes, upcoming appointments, etc.  Non-urgent messages can be sent to your provider as well.   To learn more about what you can do with MyChart, go to [email or web address].    Your next appointment:   1 year(s)  The format for your next appointment:   In Person  Provider:  Kathlen Mody, [place]:}

## 2021-08-28 NOTE — Progress Notes (Signed)
Cardiology Office Note:    Date:  08/28/2021   ID:  [name], DOB 09-22-1960, MRN [id number]  PCP:  Vicenta Aly, [place] Providers Cardiologist:  Sherren Mocha, MD     Referring MD: Vicenta Aly, FNP   Chief Complaint  Patient presents with   Coronary Artery Disease    History of Present Illness:    [name] is a 61 y.o. male with a hx of: Coronary artery disease  S/p BMS to LAD in 1998 Cath in 2010: patent LAD stent ETT in 2019 low risk Hypertension  Hyperlipidemia  Intol to statins >> Repatha  The patient is here alone today.  He is doing well from a cardiac perspective.  He denies chest pain, chest pressure, heart palpitations, leg edema, orthopnea, or PND.  He admits to exertional dyspnea, but feels that his weight gain is a large contributor to this.  His weight is up 10 to 15 pounds from baseline.  He is retired from his job at the [employer].  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     Past Medical History:  Diagnosis Date   BPH (benign prostatic hyperplasia)    Coronary artery disease  08/01/2013   BMS to LAD in 1998 // [place] in 2010 with patent stent // ETT 08/2017 Low Risk   Hyperlipidemia    intol of statins // Evolocumab Rx    Hypertension     Past Surgical History:  Procedure Laterality Date   APPENDECTOMY     CHOLECYSTECTOMY     CORONARY ANGIOPLASTY WITH STENT PLACEMENT     November 1998   ESOPHAGOGASTRODUODENOSCOPY N/A 04/12/2013   Procedure: ESOPHAGOGASTRODUODENOSCOPY (EGD);  Surgeon: Rogene Houston, MD;  [facility];  Service: Endoscopy;  Laterality: N/A;  325-moved to 130 Melanie notified pt   VASECTOMY      Current Medications: Current Meds  Medication Sig   alprostadil (EDEX) 20 MCG injection 20 mcg by Intracavitary route as directed. Per patient once or twice a month   aspirin 81 MG tablet Take 81 mg by mouth daily.   Aspirin-Acetaminophen-Caffeine (EXCEDRIN EXTRA  STRENGTH PO) Take 1 tablet by mouth 2 (two) times daily as needed (for pain).   augmented betamethasone dipropionate (DIPROLENE-AF) 0.05 % cream Apply 1 application topically 2 (two) times daily.    azelastine (ASTELIN) 0.1 % nasal spray Place 1 spray into both nostrils daily as needed for allergies. Use in each nostril as directed    montelukast (SINGULAIR) 10 MG tablet Take 10 mg by mouth at bedtime.   naproxen sodium (ANAPROX) 220 MG tablet Take 220 mg by mouth daily as needed (pain).    olmesartan (BENICAR) 20 MG tablet Take 20 mg by mouth every evening.   sucralfate (CARAFATE) 1 g tablet TAKE 1 TABLET(1 GRAM) BY MOUTH TWICE DAILY   tamsulosin (FLOMAX) 0.4 MG CAPS Take 0.4 mg by mouth every evening.    vitamin C (ASCORBIC ACID) 500 MG tablet Take 500 mg by mouth daily.   [DISCONTINUED] REPATHA SURECLICK 371 MG/ML SOAJ INJECT CONTENTS OF 1 PEN (140MG ) INTO THE SKIN EVERY 2 WEEKS.     Allergies:   Levofloxacin and Statins   Social History   Socioeconomic History   Marital status: Married    Spouse name: Not on file   Number of children: Not on file   Years of education: Not on file   Highest education level: Not on file  Occupational History   Not on file  Tobacco Use   Smoking status: Former    Types: Cigarettes    Quit date: 05/15/1996    Years since quitting: 25.3   Smokeless tobacco: Never   Tobacco comments:    quit 08/1996  Vaping Use   Vaping Use: Never used  Substance and Sexual Activity   Alcohol use: Yes    Alcohol/week: 2.0 standard drinks    Types: 2 Cans of beer per week    Comment: rare twice a week   Drug use: No   Sexual activity: Yes  Other Topics Concern   Not on file  Social History Narrative   Not on file   Social Determinants of Health   Financial Resource Strain: Not on file  Food Insecurity: Not on file  Transportation Needs: Not on file  Physical Activity: Not on file  Stress: Not on file  Social Connections: Not on file     Family  History: The patient's family history includes Aneurysm in his mother; Cancer in his maternal grandfather and maternal grandmother; Epilepsy in his mother; Heart disease in his father; Hypertension in his mother.  ROS:   Please see the history of present illness.    All other systems reviewed and are negative.  EKGs/Labs/Other Studies Reviewed:    EKG:  EKG is ordered today.  The ekg ordered today demonstrates normal sinus rhythm 70 bpm, within normal limits.  Recent Labs: No results found for requested labs within last 8760 hours.  Recent Lipid Panel    Component Value Date/Time   CHOL 93 (L) 03/28/2019 0000   TRIG 52 03/28/2019 0000   HDL 42 03/28/2019 0000   CHOLHDL 2.2 03/28/2019 0000   CHOLHDL 4.8 12/04/2008 0711   VLDL 14 12/04/2008 0711   LDLCALC 38 03/28/2019 0000     Risk Assessment/Calculations:           Physical Exam:    VS:  BP 108/80    Pulse 70    Ht 6' 1.5" (1.867 m)    Wt 256 lb 3.2 oz (116.2 kg)    SpO2 97%    BMI 33.34 kg/m     Wt Readings from Last 3 Encounters:  08/28/21 256 lb 3.2 oz (116.2 kg)  04/30/21 243 lb 12.8 oz (110.6 kg)  01/02/20 248 lb 8 oz (112.7 kg)     GEN:  Well nourished, well developed in no acute distress HEENT: Normal NECK: No JVD; No carotid bruits LYMPHATICS: No lymphadenopathy CARDIAC: RRR, no murmurs, rubs, gallops RESPIRATORY:  Clear to auscultation without rales, wheezing or rhonchi  ABDOMEN: Soft, non-tender, non-distended MUSCULOSKELETAL:  No edema; No deformity  SKIN: Warm and dry NEUROLOGIC:  Alert and oriented x 3 PSYCHIATRIC:  Normal affect   ASSESSMENT:    1. Coronary artery disease involving native coronary artery of native heart without angina pectoris   2. Essential hypertension   3. Mixed hyperlipidemia   4. Hyperlipidemia, unspecified hyperlipidemia type    PLAN:    In order of problems listed above:  The patient is doing well with no symptoms of angina.  He is treated with aspirin for  antiplatelet therapy and Repatha for lipid lowering.  He is having no anginal symptoms and does not require any specific testing this year.  He will return next year for follow-up evaluation. Blood pressure is well controlled on telmisartan.  Labs are reviewed. Treated with Repatha.  Labs are reviewed through care everywhere. Cholesterol is 92, HDL  37, LDL 37.  We discussed lifestyle modification, initiation of an exercise program, and focus on weight loss.  He will continue his current medical program.  The patient is statin intolerant. With excessive cost, will check to see if he will qualify for any drug assistance to lower his copay.    Medication Adjustments/Labs and Tests Ordered: Current medicines are reviewed at length with the patient today.  Concerns regarding medicines are outlined above.  Orders Placed This Encounter  Procedures   EKG 12-Lead   Meds ordered this encounter  Medications   Evolocumab (REPATHA SURECLICK) 428 MG/ML SOAJ    Sig: INJECT CONTENTS OF 1 PEN (140MG ) INTO THE SKIN EVERY 2 WEEKS.    Dispense:  2 mL    Refill:  11    Patient Instructions  Medication Instructions:  Repatha refill sent Your physician recommends that you continue on your current medications as directed. Please refer to the Current Medication list given to you today.  *If you need a refill on your cardiac medications before your next appointment, please call your pharmacy*   Lab Work: NONE If you have labs (blood work) drawn today and your tests are completely normal, you will receive your results only by: [place] (if you have MyChart) OR A paper copy in the mail If you have any lab test that is abnormal or we need to change your treatment, we will call you to review the results.   Testing/Procedures: NONE   Follow-Up: At [facility], you and your health needs are our priority.  As part of our continuing mission to provide you with exceptional heart care, we have created  designated Provider Care Teams.  These Care Teams include your primary Cardiologist (physician) and Advanced Practice Providers (APPs -  Physician Assistants and Nurse Practitioners) who all work together to provide you with the care you need, when you need it.  We recommend signing up for the patient portal called "MyChart".  Sign up information is provided on this After Visit Summary.  MyChart is used to connect with patients for Virtual Visits (Telemedicine).  Patients are able to view lab/test results, encounter notes, upcoming appointments, etc.  Non-urgent messages can be sent to your provider as well.   To learn more about what you can do with MyChart, go to [email or web address].    Your next appointment:   1 year(s)  The format for your next appointment:   In Person  Provider:  Kathlen Mody, [place]:}        Signed, Sherren Mocha, MD  08/28/2021 5:27 PM    [place]

## 2021-10-14 ENCOUNTER — Other Ambulatory Visit (INDEPENDENT_AMBULATORY_CARE_PROVIDER_SITE_OTHER): Payer: Self-pay | Admitting: Gastroenterology

## 2021-10-14 DIAGNOSIS — K588 Other irritable bowel syndrome: Secondary | ICD-10-CM

## 2021-10-14 NOTE — Telephone Encounter (Signed)
Seen 04/30/21 - note copied below:  ? ? Continue taking Carafate twice a day, can decrease to once a day if interested ?

## 2022-01-19 ENCOUNTER — Other Ambulatory Visit (INDEPENDENT_AMBULATORY_CARE_PROVIDER_SITE_OTHER): Payer: Self-pay | Admitting: *Deleted

## 2022-01-19 DIAGNOSIS — K588 Other irritable bowel syndrome: Secondary | ICD-10-CM

## 2022-01-19 MED ORDER — SUCRALFATE 1 G PO TABS: ORAL_TABLET | ORAL | 0 refills | Status: DC

## 2022-02-19 ENCOUNTER — Telehealth: Payer: Self-pay

## 2022-02-19 NOTE — Telephone Encounter (Signed)
   Name: [name]  DOB: Nov 24, 1960  MRN: [id number]  Primary Cardiologist: Sherren Mocha, MD   Preoperative team, please contact this patient and set up a phone call appointment for further preoperative risk assessment. Please obtain consent and complete medication review. Thank you for your help.  I confirm that guidance regarding antiplatelet and oral anticoagulation therapy has been completed and, if necessary, noted below.  Per office protocol, if patient is without any new symptoms or concerns at the time of their virtual visit, he may hold aspirin for 5-7 days prior to procedure. Please resume aspirin as soon as possible postprocedure, at the discretion of the surgeon.    Lenna Sciara, NP 02/19/2022, 12:25 PM [place]

## 2022-02-19 NOTE — Telephone Encounter (Signed)
  Patient Consent for Virtual Visit         [name] has provided verbal consent on 02/19/2022 for a virtual visit (video or telephone).   CONSENT FOR VIRTUAL VISIT FOR:  [name]  By participating in this virtual visit I agree to the following:  I hereby voluntarily request, consent and authorize [facility] and its employed or contracted physicians, physician assistants, nurse practitioners or other licensed health care professionals (the Practitioner), to provide me with telemedicine health care services (the "Services") as deemed necessary by the treating Practitioner. I acknowledge and consent to receive the Services by the Practitioner via telemedicine. I understand that the telemedicine visit will involve communicating with the Practitioner through live audiovisual communication technology and the disclosure of certain medical information by electronic transmission. I acknowledge that I have been given the opportunity to request an in-person assessment or other available alternative prior to the telemedicine visit and am voluntarily participating in the telemedicine visit.  I understand that I have the right to withhold or withdraw my consent to the use of telemedicine in the course of my care at any time, without affecting my right to future care or treatment, and that the Practitioner or I may terminate the telemedicine visit at any time. I understand that I have the right to inspect all information obtained and/or recorded in the course of the telemedicine visit and may receive copies of available information for a reasonable fee.  I understand that some of the potential risks of receiving the Services via telemedicine include:  Delay or interruption in medical evaluation due to technological equipment failure or disruption; Information transmitted may not be sufficient (e.g. poor resolution of images) to allow for appropriate medical decision making by the Practitioner; and/or   In rare instances, security protocols could fail, causing a breach of personal health information.  Furthermore, I acknowledge that it is my responsibility to provide information about my medical history, conditions and care that is complete and accurate to the best of my ability. I acknowledge that Practitioner's advice, recommendations, and/or decision may be based on factors not within their control, such as incomplete or inaccurate data provided by me or distortions of diagnostic images or specimens that may result from electronic transmissions. I understand that the practice of medicine is not an exact science and that Practitioner makes no warranties or guarantees regarding treatment outcomes. I acknowledge that a copy of this consent can be made available to me via my patient portal ([facility]), or I can request a printed copy by calling the office of [place].    I understand that my insurance will be billed for this visit.   I have read or had this consent read to me. I understand the contents of this consent, which adequately explains the benefits and risks of the Services being provided via telemedicine.  I have been provided ample opportunity to ask questions regarding this consent and the Services and have had my questions answered to my satisfaction. I give my informed consent for the services to be provided through the use of telemedicine in my medical care

## 2022-02-19 NOTE — Telephone Encounter (Signed)
   Pre-operative Risk Assessment    Patient Name: [name]  DOB: 04-04-1961 MRN: [id number]     Request for Surgical Clearance    Procedure:  Gum graft, tissue removal   Date of Surgery:  Clearance TBD                                 Surgeon:  Dr. Mike Gip  Surgeon's Group or Practice Name:  [facility]  Phone number:  [phone] Fax number:  [phone]   Type of Clearance Requested:   - Pharmacy:  Hold Aspirin     Type of Anesthesia:  Local  {   Signed, [name]   02/19/2022, 11:36 AM

## 2022-02-19 NOTE — Telephone Encounter (Signed)
Patient scheduled for tele visit on 8/22. Med rec and consent done

## 2022-02-23 ENCOUNTER — Telehealth: Payer: Federal, State, Local not specified - PPO

## 2022-03-04 ENCOUNTER — Ambulatory Visit: Payer: Federal, State, Local not specified - PPO | Attending: Physician Assistant | Admitting: Physician Assistant

## 2022-03-04 DIAGNOSIS — Z01810 Encounter for preprocedural cardiovascular examination: Secondary | ICD-10-CM

## 2022-03-04 NOTE — Progress Notes (Signed)
Virtual Visit via Telephone Note   Because of [name]'s co-morbid illnesses, he is at least at moderate risk for complications without adequate follow up.  This format is felt to be most appropriate for this patient at this time.  The patient did not have access to video technology/had technical difficulties with video requiring transitioning to audio format only (telephone).  All issues noted in this document were discussed and addressed.  No physical exam could be performed with this format.  Please refer to the patient's chart for his consent to telehealth for [facility].  Evaluation Performed:  Preoperative cardiovascular risk assessment _____________   Date:  03/04/2022   Patient ID:  [name], DOB 12-Dec-1960, MRN [id number] Patient Location:  Home Provider location:   Office  Primary Care Provider:  Vicenta Aly, [place] Primary Cardiologist:  Sherren Mocha, MD  Chief Complaint / Patient Profile   61 y.o. y/o male with a h/o CAD, hypertension, and hyperlipidemia on Repatha due to intolerance of statin who is pending gum graft and tissue removal procedure by periodontist and presents today for telephonic preoperative cardiovascular risk assessment.  Past Medical History    Past Medical History:  Diagnosis Date   BPH (benign prostatic hyperplasia)    Coronary artery disease  08/01/2013   BMS to LAD in 1998 // [place] in 2010 with patent stent // ETT 08/2017 Low Risk   Hyperlipidemia    intol of statins // Evolocumab Rx    Hypertension    Past Surgical History:  Procedure Laterality Date   APPENDECTOMY     CHOLECYSTECTOMY     CORONARY ANGIOPLASTY WITH STENT PLACEMENT     November 1998   ESOPHAGOGASTRODUODENOSCOPY N/A 04/12/2013   Procedure: ESOPHAGOGASTRODUODENOSCOPY (EGD);  Surgeon: Rogene Houston, MD;  Location[facility];  Service: Endoscopy;  Laterality: N/A;  325-moved to 130 Melanie notified pt   VASECTOMY      Allergies  Allergies   Allergen Reactions   Levofloxacin Palpitations   Statins Other (See Comments)    Muscles hurt. Muscles hurt.    History of Present Illness    [name] is a 61 y.o. male who presents via audio/video conferencing for a telehealth visit today.  Pt was last seen in cardiology clinic on 08/31/2021 by Dr. Sherren Mocha.  At that time [name] was doing well.  The patient is now pending procedure as outlined above. Since his last visit, he has been doing well without exertional chest pain or worsening dyspnea.   Home Medications    Prior to Admission medications   Medication Sig Start Date End Date Taking? Authorizing Provider  alprostadil (EDEX) 20 MCG injection 20 mcg by Intracavitary route as directed. Per patient once or twice a month    [provider]  aspirin 81 MG tablet Take 81 mg by mouth daily.    [provider]  Aspirin-Acetaminophen-Caffeine (EXCEDRIN EXTRA STRENGTH PO) Take 1 tablet by mouth 2 (two) times daily as needed (for pain).    [provider]  augmented betamethasone dipropionate (DIPROLENE-AF) 0.05 % cream Apply 1 application topically 2 (two) times daily.     [provider]  azelastine (ASTELIN) 0.1 % nasal spray Place 1 spray into both nostrils daily as needed for allergies. Use in each nostril as directed     [provider]  dutasteride (AVODART) 0.5 MG capsule Take 1 capsule by mouth daily. 01/18/22   [provider]  Evolocumab (REPATHA  SURECLICK) 409 MG/ML SOAJ INJECT CONTENTS OF 1 PEN ('140MG'$ ) INTO THE SKIN EVERY 2 WEEKS. 08/28/21   Sherren Mocha, MD  montelukast (SINGULAIR) 10 MG tablet Take 10 mg by mouth at bedtime.    [provider]  naproxen sodium (ANAPROX) 220 MG tablet Take 220 mg by mouth daily as needed (pain).     [provider]  olmesartan (BENICAR) 20 MG tablet Take 20 mg by mouth every evening.    [provider]  sucralfate (CARAFATE) 1 g tablet TAKE 1  TABLET(1 GRAM) BY MOUTH TWICE DAILY 01/19/22   Montez Morita, Quillian Quince, MD  tamsulosin (FLOMAX) 0.4 MG CAPS Take 0.4 mg by mouth every evening.  Patient not taking: Reported on 02/19/2022    [provider]  vitamin C (ASCORBIC ACID) 500 MG tablet Take 500 mg by mouth daily.    [provider]    Physical Exam    Vital Signs:  [name] does not have vital signs available for review today.  Given telephonic nature of communication, physical exam is limited. AAOx3. NAD. Normal affect.  Speech and respirations are unlabored.  Accessory Clinical Findings    None  Assessment & Plan    1.  Preoperative Cardiovascular Risk Assessment:   -Patient require upcoming peritoneal procedure.  He has a history of BMS to LAD in 1998, previous cardiac catheterization in 2010 showed patent stent.  ETT in 2019 was low risk.  He has not had any recent exertional chest pain or worsening dyspnea.  He may hold aspirin for 5 days prior to the procedure and restart as soon as possible afterward at the surgeon's discretion.  He does not have any prior valve surgery or congenital heart issue, no SBE prophylaxis is needed.   A copy of this note will be routed to requesting surgeon.  Time:   Today, I have spent 4 minutes with the patient with telehealth technology discussing medical history, symptoms, and management plan.     [place], [place]  03/04/2022, 2:45 PM

## 2022-07-02 ENCOUNTER — Other Ambulatory Visit: Payer: Self-pay | Admitting: Cardiovascular Disease

## 2022-07-02 DIAGNOSIS — E785 Hyperlipidemia, unspecified: Secondary | ICD-10-CM

## 2022-08-23 ENCOUNTER — Other Ambulatory Visit (INDEPENDENT_AMBULATORY_CARE_PROVIDER_SITE_OTHER): Payer: Self-pay | Admitting: Gastroenterology

## 2022-08-23 DIAGNOSIS — K588 Other irritable bowel syndrome: Secondary | ICD-10-CM

## 2022-09-02 NOTE — Progress Notes (Signed)
  Cardiology Office Note:    Date:  09/03/2022  ID:  [name], DOB [birth year], MRN [id number] [place] Providers Cardiologist:  Sherren Mocha, MD      Patient Profile:   Coronary artery disease  S/p BMS to LAD in 1998 Cath in 2010: patent LAD stent TTE 11/17/2006: EF >55, normal RVSF, mild MR, mild TR ETT 08/30/2017 low risk Hypertension  Hyperlipidemia  Intol to statins >> Repatha    History of Present Illness:   [name] is a 62 y.o. male returns for f/u on CAD. He was last seen by Dr. Burt Knack 08/28/21.  He is here alone.  He is doing well without chest pain.  He has shortness of breath with more extreme activities.  However, he can perform most activities without dyspnea.  He has not had orthopnea, leg edema or syncope.  Review of Systems  Cardiovascular:  Negative for claudication.  Genitourinary:  Positive for incomplete emptying (seeing urology for BPH symptoms).       Studies Reviewed:    EKG: NSR, HR 70, normal axis, no ST-T wave changes, QTc 425   Risk Assessment/Calculations:             Physical Exam:   VS:  BP (!) 130/58   Pulse 70   Ht 6' 1.5" (1.867 m)   Wt 258 lb 6.4 oz (117.2 kg)   SpO2 96%   BMI 33.63 kg/m    Wt Readings from Last 3 Encounters:  09/03/22 258 lb 6.4 oz (117.2 kg)  08/28/21 256 lb 3.2 oz (116.2 kg)  04/30/21 243 lb 12.8 oz (110.6 kg)    Constitutional:      Appearance: Healthy appearance. Not in distress.  Neck:     Vascular: JVD normal.  Pulmonary:     Breath sounds: Normal breath sounds. No wheezing. No rales.  Cardiovascular:     Normal rate. Regular rhythm. Normal S1. Normal S2.      Murmurs: There is no murmur.  Edema:    Peripheral edema absent.  Abdominal:     Palpations: Abdomen is soft.      ASSESSMENT AND PLAN:   Coronary artery disease  History of bare-metal stent to the LAD in 1998.  Last cardiac catheterization in 2010 demonstrated a patent LAD stent.  Last exercise treadmill test in 2019 was  low risk.  He is doing well without chest discomfort to suggest angina.  Labs reviewed through care everywhere.  His LDL in September 2023 was optimal at 60.  His blood pressure is well-controlled.  Continue aspirin 81 mg daily, Repatha 140 mg every 2 weeks, olmesartan 20 mg daily.        Dispo:  Return in about 1 year (around 09/03/2023) for Routine Follow Up, w/ Dr. Burt Knack, or Richardson Dopp, PA-C. Signed, Richardson Dopp, PA-C

## 2022-09-03 ENCOUNTER — Encounter: Payer: Self-pay | Admitting: Physician Assistant

## 2022-09-03 ENCOUNTER — Ambulatory Visit: Payer: Federal, State, Local not specified - PPO | Attending: Physician Assistant | Admitting: Physician Assistant

## 2022-09-03 VITALS — BP 130/58 | HR 70 | Ht 73.5 in | Wt 258.4 lb

## 2022-09-03 DIAGNOSIS — I2510 Atherosclerotic heart disease of native coronary artery without angina pectoris: Secondary | ICD-10-CM | POA: Diagnosis not present

## 2022-09-03 NOTE — Patient Instructions (Signed)
Medication Instructions:  Your physician recommends that you continue on your current medications as directed. Please refer to the Current Medication list given to you today.  *If you need a refill on your cardiac medications before your next appointment, please call your pharmacy*   Lab Work: None ordered  If you have labs (blood work) drawn today and your tests are completely normal, you will receive your results only by: [place] (if you have MyChart) OR A paper copy in the mail If you have any lab test that is abnormal or we need to change your treatment, we will call you to review the results.   Testing/Procedures: None ordered   Follow-Up: At [facility], you and your health needs are our priority.  As part of our continuing mission to provide you with exceptional heart care, we have created designated Provider Care Teams.  These Care Teams include your primary Cardiologist (physician) and Advanced Practice Providers (APPs -  Physician Assistants and Nurse Practitioners) who all work together to provide you with the care you need, when you need it.  We recommend signing up for the patient portal called "MyChart".  Sign up information is provided on this After Visit Summary.  MyChart is used to connect with patients for Virtual Visits (Telemedicine).  Patients are able to view lab/test results, encounter notes, upcoming appointments, etc.  Non-urgent messages can be sent to your provider as well.   To learn more about what you can do with MyChart, go to [email or web address].    Your next appointment:   1 year(s)  Provider:   Sherren Mocha, MD  or Richardson Dopp, PA-C         Other Instructions

## 2022-09-03 NOTE — Assessment & Plan Note (Addendum)
History of bare-metal stent to the LAD in 1998.  Last cardiac catheterization in 2010 demonstrated a patent LAD stent.  Last exercise treadmill test in 2019 was low risk.  He is doing well without chest discomfort to suggest angina.  Labs reviewed through care everywhere.  His LDL in September 2023 was optimal at 60.  His blood pressure is well-controlled.  Continue aspirin 81 mg daily, Repatha 140 mg every 2 weeks, olmesartan 20 mg daily.

## 2023-02-09 ENCOUNTER — Other Ambulatory Visit (HOSPITAL_COMMUNITY): Payer: Self-pay

## 2023-03-01 ENCOUNTER — Other Ambulatory Visit (HOSPITAL_COMMUNITY): Payer: Self-pay

## 2023-03-21 ENCOUNTER — Telehealth: Payer: Self-pay | Admitting: Pharmacy Technician

## 2023-03-21 ENCOUNTER — Other Ambulatory Visit (HOSPITAL_COMMUNITY): Payer: Self-pay

## 2023-03-21 NOTE — Telephone Encounter (Signed)
Pharmacy Patient Advocate Encounter   Received notification from Fax that prior authorization for repatha is required/requested.   Insurance verification completed.   The patient is insured through [facility] .   Per test claim: PA required; PA submitted to [facility] via CoverMyMeds Key/confirmation #/EOC [id number] Status is pending

## 2023-03-22 ENCOUNTER — Other Ambulatory Visit (HOSPITAL_COMMUNITY): Payer: Self-pay

## 2023-03-22 NOTE — Telephone Encounter (Signed)
Pharmacy Patient Advocate Encounter  Received notification from [facility] that Prior Authorization for repatha has been APPROVED from 03/21/23 to 03/19/24. Ran test claim, Copay is $78.58. This test claim was processed through [facility]- copay amounts may vary at other pharmacies due to pharmacy/plan contracts, or as the patient moves through the different stages of their insurance plan.   PA #/Case ID/Reference #: [id number]

## 2023-07-03 ENCOUNTER — Other Ambulatory Visit: Payer: Self-pay | Admitting: Cardiovascular Disease

## 2023-07-03 DIAGNOSIS — E785 Hyperlipidemia, unspecified: Secondary | ICD-10-CM

## 2023-12-08 NOTE — Progress Notes (Signed)
 OFFICE NOTE Date:  12/09/2023  ID:  Marrian Siva, DOB January 02, 1961, MRN [id number] PCP: Yvonnie Heritage, FNP  [facility] Providers Cardiologist:  Arnoldo Lapping, MD     Patient Profile:     Coronary artery disease  S/p BMS to LAD in 1998 Cath in 2010: patent LAD stent TTE 11/17/2006: EF >55, normal RVSF, mild MR, mild TR ETT 08/30/2017 low risk Hypertension  Hyperlipidemia  Intol to statins >> Repatha       Discussed the use of AI scribe software for clinical note transcription with the patient, who gave verbal consent to proceed. History of Present Illness [name] is a 63 y.o. male who returns for follow-up of CAD.  He was last seen in March 2024.  He is here alone. He experiences intermittent symptoms of feeling flushed and lightheaded after physical activity, occasional night sweats, and mild shortness of breath with exertion. No chest pain, heart racing, or significant leg swelling. He reports gastrointestinal issues, including alternating constipation and diarrhea, and left upper quadrant pain that sometimes worsens after eating spicy foods. This pain can make him feel unwell but resolves when the pain subsides. No significant weight changes, bleeding, or black stools.  No unusual fatigue, skin changes.    ROS-See HPI    Studies Reviewed:  EKG Interpretation Date/Time:  Friday December 09 2023 08:35:45 EDT Ventricular Rate:  59 PR Interval:  178 QRS Duration:  84 QT Interval:  412 QTC Calculation: 407 R Axis:   19  Text Interpretation: Sinus bradycardia No significant change since last tracing Confirmed by Marlyse Single [phone]) on 12/09/2023 9:09:51 AM    Results LABS Total cholesterol: 90 mg/dL (19/14/7829) Triglycerides: 123 mg/dL (56/21/3086) HDL: 35 mg/dL (57/84/6962) LDL: 33 mg/dL (95/28/4132) Creatinine: 1.15 mg/dL (44/07/270) Potassium: 4.8 mmol/L (03/28/2023) ALT: 13 U/L (03/28/2023) Hemoglobin: 15.4 g/dL (53/66/4403)  Risk  Assessment/Calculations:         Physical Exam:  VS:  BP 125/80   Pulse (!) 59   Ht 6\' 2"  (1.88 m)   Wt 258 lb 8 oz (117.3 kg)   SpO2 97%   BMI 33.19 kg/m    Wt Readings from Last 3 Encounters:  12/09/23 258 lb 8 oz (117.3 kg)  09/03/22 258 lb 6.4 oz (117.2 kg)  08/28/21 256 lb 3.2 oz (116.2 kg)    Constitutional:      Appearance: Healthy appearance. Not in distress.  Neck:     Vascular: No carotid bruit. JVD normal.  Pulmonary:     Breath sounds: Normal breath sounds. No wheezing. No rales.  Cardiovascular:     Normal rate. Regular rhythm.     Murmurs: There is no murmur.  Edema:    Peripheral edema absent.  Abdominal:     Palpations: Abdomen is soft.      Assessment and Plan: Assessment & Plan Coronary artery disease  Coronary artery disease, status post stent placement in the LAD in 1998. Patent LAD stent by cardiac catheterization in 2010. Last exercise treadmill test in February 2019 was low risk.  He reports intermittent symptoms of exhaustion with certain activities, but no consistent chest pain or discomfort. Symptoms are inconsistent and may be related to gastrointestinal issues. Given the time since the last ischemic assessment and PCI, follow-up stress testing is reasonable to evaluate current cardiac status.  - Order exercise MPI to rule out ischemia, chronotropic incompetence - Continue aspirin  81 mg daily. - Continue Repatha  140 mg subcutaneously every two weeks. - Follow up in  one year unless stress test results are abnormal. Pure hypercholesterolemia Hyperlipidemia is optimally managed with current lipid levels showing total cholesterol at 90, triglycerides at 123, HDL at 35, and LDL at 33. Intolerant to statins - Continue Repatha  140 mg subcutaneously every two weeks. - Refill Repatha -prescription sent to his pharmacy. Primary hypertension Hypertension is well-controlled on current medication regimen. - Continue olmesartan 20 mg daily. Nonrheumatic  mitral valve regurgitation Mild mitral regurgitation noted on echocardiogram in May 2008. Given recent symptoms, we will arrange follow-up echocardiogram. - Order follow-up echocardiogram    Informed Consent   Shared Decision Making/Informed Consent The risks [chest pain, shortness of breath, cardiac arrhythmias, dizziness, blood pressure fluctuations, myocardial infarction, stroke/transient ischemic attack, nausea, vomiting, allergic reaction, radiation exposure, metallic taste sensation and life-threatening complications (estimated to be 1 in 10,000)], benefits (risk stratification, diagnosing coronary artery disease, treatment guidance) and alternatives of a nuclear stress test were discussed in detail with [name] and he agrees to proceed.     Dispo:  Return in about 1 year (around 12/08/2024) for Routine Follow Up, w/ Dr. Arlester Ladd, or Marlyse Single, PA-C. Signed, Marlyse Single, PA-C

## 2023-12-09 ENCOUNTER — Encounter: Payer: Self-pay | Admitting: Physician Assistant

## 2023-12-09 ENCOUNTER — Ambulatory Visit: Attending: Physician Assistant | Admitting: Physician Assistant

## 2023-12-09 VITALS — BP 125/80 | HR 59 | Ht 74.0 in | Wt 258.5 lb

## 2023-12-09 DIAGNOSIS — E7800 Pure hypercholesterolemia, unspecified: Secondary | ICD-10-CM | POA: Diagnosis not present

## 2023-12-09 DIAGNOSIS — I2510 Atherosclerotic heart disease of native coronary artery without angina pectoris: Secondary | ICD-10-CM

## 2023-12-09 DIAGNOSIS — I340 Nonrheumatic mitral (valve) insufficiency: Secondary | ICD-10-CM | POA: Diagnosis not present

## 2023-12-09 DIAGNOSIS — I10 Essential (primary) hypertension: Secondary | ICD-10-CM

## 2023-12-09 MED ORDER — REPATHA SURECLICK 140 MG/ML ~~LOC~~ SOAJ: SUBCUTANEOUS | 11 refills | Status: DC

## 2023-12-09 NOTE — Assessment & Plan Note (Addendum)
 Hyperlipidemia is optimally managed with current lipid levels showing total cholesterol at 90, triglycerides at 123, HDL at 35, and LDL at 33. Intolerant to statins - Continue Repatha  140 mg subcutaneously every two weeks. - Refill Repatha -prescription sent to his pharmacy.

## 2023-12-09 NOTE — Patient Instructions (Addendum)
 Medication Instructions:  NO CHANGES   Lab Work: NONE   Testing/Procedures: Your physician has requested that you have an ECHOCARDIOGRAM. Echocardiography is a painless test that uses sound waves to create images of your heart. It provides your doctor with information about the size and shape of your heart and how well your heart's chambers and valves are working. This procedure takes approximately one hour. There are no restrictions for this procedure. Please do NOT wear cologne, perfume, aftershave, or lotions (deodorant is allowed). Please arrive 15 minutes prior to your appointment time.  Please note: We ask at that you not bring children with you during ultrasound (echo/ vascular) testing. Due to room size and safety concerns, children are not allowed in the ultrasound rooms during exams. Our front office staff cannot provide observation of children in our lobby area while testing is being conducted. An adult accompanying a patient to their appointment will only be allowed in the ultrasound room at the discretion of the ultrasound technician under special circumstances. We apologize for any inconvenience.   Your physician has requested that you have en exercise stress myoview. For further information please visit [email or web address] Please follow instruction sheet, as given.   Follow-Up: At [facility], you and your health needs are our priority.  As part of our continuing mission to provide you with exceptional heart care, our providers are all part of one team.  This team includes your primary Cardiologist (physician) and Advanced Practice Providers or APPs (Physician Assistants and Nurse Practitioners) who all work together to provide you with the care you need, when you need it.  Your next appointment:   1 YEAR  Provider:   Arnoldo Lapping, MD OR Marlyse Single, PA

## 2023-12-09 NOTE — Assessment & Plan Note (Addendum)
 Hypertension is well-controlled on current medication regimen. - Continue olmesartan 20 mg daily.

## 2023-12-09 NOTE — Assessment & Plan Note (Addendum)
 Coronary artery disease, status post stent placement in the LAD in 1998. Patent LAD stent by cardiac catheterization in 2010. Last exercise treadmill test in February 2019 was low risk.  He reports intermittent symptoms of exhaustion with certain activities, but no consistent chest pain or discomfort. Symptoms are inconsistent and may be related to gastrointestinal issues. Given the time since the last ischemic assessment and PCI, follow-up stress testing is reasonable to evaluate current cardiac status.  - Order exercise MPI to rule out ischemia, chronotropic incompetence - Continue aspirin  81 mg daily. - Continue Repatha  140 mg subcutaneously every two weeks. - Follow up in one year unless stress test results are abnormal.

## 2023-12-19 ENCOUNTER — Other Ambulatory Visit: Payer: Self-pay | Admitting: Cardiovascular Disease

## 2023-12-19 DIAGNOSIS — E7800 Pure hypercholesterolemia, unspecified: Secondary | ICD-10-CM

## 2023-12-29 ENCOUNTER — Other Ambulatory Visit: Payer: Self-pay | Admitting: Physician Assistant

## 2023-12-29 DIAGNOSIS — I2510 Atherosclerotic heart disease of native coronary artery without angina pectoris: Secondary | ICD-10-CM

## 2024-01-17 ENCOUNTER — Encounter (HOSPITAL_COMMUNITY): Payer: Self-pay | Admitting: *Deleted

## 2024-01-17 ENCOUNTER — Telehealth (HOSPITAL_COMMUNITY): Payer: Self-pay | Admitting: *Deleted

## 2024-01-17 NOTE — Telephone Encounter (Signed)
 Letter with instructions for upcoming stress test sent via [employer].  Argentina Bees, RN

## 2024-01-25 ENCOUNTER — Ambulatory Visit: Payer: Self-pay | Admitting: Physician Assistant

## 2024-01-25 ENCOUNTER — Ambulatory Visit (HOSPITAL_COMMUNITY)
Admission: RE | Admit: 2024-01-25 | Discharge: 2024-01-25 | Disposition: A | Discharge status: Home / Self Care | Source: Ambulatory Visit | Attending: Internal Medicine | Admitting: Internal Medicine

## 2024-01-25 ENCOUNTER — Ambulatory Visit (HOSPITAL_COMMUNITY)
Admission: RE | Admit: 2024-01-25 | Discharge: 2024-01-25 | Disposition: A | Discharge status: Home / Self Care | Source: Ambulatory Visit | Attending: Physician Assistant | Admitting: Physician Assistant

## 2024-01-25 DIAGNOSIS — I2510 Atherosclerotic heart disease of native coronary artery without angina pectoris: Secondary | ICD-10-CM

## 2024-01-25 DIAGNOSIS — I340 Nonrheumatic mitral (valve) insufficiency: Secondary | ICD-10-CM | POA: Diagnosis not present

## 2024-01-25 LAB — MYOCARDIAL PERFUSION IMAGING
Angina Index: 0
Base ST Depression (mm): 0 mm
Duke Treadmill Score: 7
Estimated workload: 8.2
Exercise duration (min): 7 min
Exercise duration (sec): 17 s
LV dias vol: 115 mL (ref 62–150)
LV sys vol: 43 mL (ref 4.2–5.8)
MPHR: 155 {beats}/min
Nuc Stress EF: 63 %
Peak HR: 141 {beats}/min
Percent HR: 90 %
Rest HR: 59 {beats}/min
Rest Nuclear Isotope Dose: 10.9 mCi
SDS: 1
SRS: 8
SSS: 5
ST Depression (mm): 0 mm
Stress Nuclear Isotope Dose: 32.9 mCi
TID: 0.85

## 2024-01-25 LAB — ECHOCARDIOGRAM COMPLETE
Area-P 1/2: 3.36 cm2
S' Lateral: 3.43 cm

## 2024-01-25 MED ORDER — TECHNETIUM TC 99M TETROFOSMIN IV KIT
32.9000 | PACK | Freq: Once | INTRAVENOUS | Status: AC | PRN
  Administered 2024-01-25: 32.9 via INTRAVENOUS

## 2024-01-25 MED ORDER — TECHNETIUM TC 99M TETROFOSMIN IV KIT
10.9000 | PACK | Freq: Once | INTRAVENOUS | Status: AC | PRN
  Administered 2024-01-25: 10.9 via INTRAVENOUS

## 2024-01-26 ENCOUNTER — Ambulatory Visit: Payer: Self-pay | Admitting: Physician Assistant

## 2024-04-18 ENCOUNTER — Encounter (INDEPENDENT_AMBULATORY_CARE_PROVIDER_SITE_OTHER): Payer: Self-pay | Admitting: Gastroenterology

## 2024-04-23 ENCOUNTER — Telehealth: Payer: Self-pay | Admitting: Pharmacy Technician

## 2024-04-23 NOTE — Telephone Encounter (Signed)
   Pharmacy Patient Advocate Encounter   Received notification from CoverMyMeds that prior authorization for repatha  is required/requested.   Insurance verification completed.   The patient is insured through [facility].   Per test claim: PA required; PA submitted to above mentioned insurance via Latent Key/confirmation #/EOC [id number] Status is pending

## 2024-04-23 NOTE — Telephone Encounter (Signed)
 Pharmacy Patient Advocate Encounter  Received notification from [facility] that Prior Authorization for repatha  has been APPROVED from 04/23/24 to 04/23/25   PA #/Case ID/Reference #: [id number]

## 2024-05-15 ENCOUNTER — Encounter: Payer: Self-pay | Admitting: Internal Medicine

## 2024-05-15 ENCOUNTER — Ambulatory Visit (INDEPENDENT_AMBULATORY_CARE_PROVIDER_SITE_OTHER): Admitting: Internal Medicine

## 2024-05-15 VITALS — BP 124/73 | HR 57 | Ht 74.0 in | Wt 258.0 lb

## 2024-05-15 DIAGNOSIS — J310 Chronic rhinitis: Secondary | ICD-10-CM | POA: Insufficient documentation

## 2024-05-15 DIAGNOSIS — J432 Centrilobular emphysema: Secondary | ICD-10-CM | POA: Diagnosis not present

## 2024-05-15 NOTE — Assessment & Plan Note (Addendum)
 Seasonal flares x years  rx with flonase / astelin prn  - Allergy screen 05/15/2024 >  Eos 0. /  IgE  pending   Reviewed nasal anatomy and need for topicals to reach the target tissue using dermatitis as an example treated with steroid cream effectively only Iif  the meds reach their target. >>> f/u allergy consult prn   see avs for instructions unique to this ov          Each maintenance medication was reviewed in detail including emphasizing most importantly the difference between maintenance and prns and under what circumstances the prns are to be triggered using an action plan format where appropriate.  Total time for H and P, chart review, counseling, reviewing nasal  device(s) and generating customized AVS unique to this office visit / same day charting = 46 min new pt eval

## 2024-05-15 NOTE — Progress Notes (Signed)
 [name], male    DOB: [birth year]    MRN: [id number]   Brief patient profile:  29  yowm  quit feb 1998 with IHD a year later  referred to pulmonary clinic in [place]  05/15/2024 by [name] for emphysema evaluation.    Pt not previously seen by PCCM service.     History of Present Illness  05/15/2024  Pulmonary/ 1st office eval/ [name] / [place] [facility]  Chief Complaint  Patient presents with   Establish Care    CT showed emphsema per pcp [name]   Dyspnea:  same pace as others / 50 ft mailbox steps on landing  - really not limited from disired activities  Cough: assoc with pnds with weather changes / flonase / azelastin  Sleep: bed is flat/ several pillows due to overt HB  SABA use: none  02: none    No obvious day to day or daytime pattern/variability or assoc excess/ purulent sputum or mucus plugs or hemoptysis or cp or chest tightness, subjective wheeze.    Also denies any obvious fluctuation of symptoms with weather or environmental changes or other aggravating or alleviating factors except as outlined above   No unusual exposure hx or h/o childhood pna/ asthma or knowledge of premature birth.  Current Allergies, Complete Past Medical History, Past Surgical History, Family History, and Social History were reviewed in [employer] record.  ROS  The following are not active complaints unless bolded Hoarseness, sore throat, dysphagia, dental problems, itching, sneezing,  nasal congestion or discharge of excess mucus or purulent secretions, ear ache,   fever, chills, sweats, unintended wt loss or wt gain, classically pleuritic or exertional cp,  orthopnea pnd or arm/hand swelling  or leg swelling, presyncope, palpitations, abdominal pain, anorexia, nausea, vomiting, diarrhea  or change in bowel habits or change in bladder habits, change in stools or change in urine, dysuria, hematuria,  rash, arthralgias, visual complaints, headache, numbness,  weakness or ataxia or problems with walking or coordination,  change in mood or  memory.            Outpatient Medications Prior to Visit  Medication Sig Dispense Refill   aspirin  81 MG tablet Take 81 mg by mouth daily.     Aspirin -Acetaminophen -Caffeine (EXCEDRIN EXTRA STRENGTH PO) Take 1 tablet by mouth 2 (two) times daily as needed (for pain).     augmented betamethasone dipropionate (DIPROLENE-AF) 0.05 % cream Apply 1 application topically 2 (two) times daily.      azelastine (ASTELIN) 0.1 % nasal spray Place 1 spray into both nostrils daily as needed for allergies. Use in each nostril as directed      Evolocumab  (REPATHA  SURECLICK) 140 MG/ML SOAJ INJECT CONTENTS OF 1 PEN (140MG ) INTO THE SKIN EVERY 2 WEEKS. 2 mL 11   montelukast (SINGULAIR) 10 MG tablet Take 10 mg by mouth at bedtime.     naproxen sodium (ANAPROX) 220 MG tablet Take 220 mg by mouth daily as needed (pain).      olmesartan (BENICAR) 20 MG tablet Take 20 mg by mouth every evening.     tadalafil (CIALIS) 5 MG tablet Take 5 mg by mouth daily.     vitamin C (ASCORBIC ACID) 500 MG tablet Take 500 mg by mouth daily.     No facility-administered medications prior to visit.    Past Medical History:  Diagnosis Date   BPH (benign prostatic hyperplasia)    Coronary artery disease  08/01/2013   BMS to LAD in  56 // LHC in 2010 with patent stent // ETT 08/2017 Low Risk   Hyperlipidemia    intol of statins // Evolocumab  Rx    Hypertension       Objective:     BP 124/73   Pulse (!) 57   Ht 6' 2 (1.88 m)   Wt 258 lb (117 kg)   SpO2 99% Comment: ra  BMI 33.13 kg/m   SpO2: 99 % (ra) pleasant healthy appearing amb wm nad   HEENT : Oropharynx  clear      Nasal turbinates L > R non specific mod edema    NECK :  without  apparent JVD/ palpable Nodes/TM    LUNGS: no acc muscle use,  Nl contour chest which is clear to A and P bilaterally without cough on insp or exp maneuvers   CV:  RRR  no s3 or murmur or increase  in P2, and no edema   ABD:  soft and nontender   MS:  Gait nl   ext warm without deformities Or obvious joint restrictions  calf tenderness, cyanosis or clubbing    SKIN: warm and dry without lesions    NEURO:  alert, approp, nl sensorium with  no motor or cerebellar deficits apparent.       Assessment   Assessment & Plan Centrilobular emphysema (HCC) Quit smoking 1998  s apparent sequelae  - cardiac Ct  01/25/24 Emphysema.  - Alpha one AT phenotype 05/15/2024 >>>   No clinical evidence of significant copd (Not limited by breathing from desired activities) and no tendency to aecopd/ bronchitis flares despite active rhinitis (see below)  >>>  No f/u needed other than PFTs for baseline purposes.    Rhinitis, chronic Seasonal flares x years  rx with flonase / astelin prn  - Allergy screen 05/15/2024 >  Eos 0. /  IgE  pending   Reviewed nasal anatomy and need for topicals to reach the target tissue using dermatitis as an example treated with steroid cream effectively only Iif  the meds reach their target. >>> f/u allergy consult prn   see avs for instructions unique to this ov          Each maintenance medication was reviewed in detail including emphasizing most importantly the difference between maintenance and prns and under what circumstances the prns are to be triggered using an action plan format where appropriate.  Total time for H and P, chart review, counseling, reviewing nasal  device(s) and generating customized AVS unique to this office visit / same day charting = 46 min new pt eval          AVS  Patient Instructions  I emphasized that nasal steroids have no immediate benefit in terms of improving symptoms.  To help them reached the target tissue, the patient should use Afrin two puffs every 12 hours applied one min before using the nasal steroids.  Afrin should be stopped after no more than 5 days.  If the symptoms worsen, Afrin can be restarted after 5 days off of  therapy to prevent rebound congestion from overuse of Afrin.  I also emphasized that in no way are nasal steroids a concern in terms of addiction.    Please remember to go to the lab department   for your tests - we will call you with the results when they are available.      PFTs will be ordered today and we will call you with results    Ozell America,  MD 05/15/2024

## 2024-05-15 NOTE — Patient Instructions (Signed)
 I emphasized that nasal steroids have no immediate benefit in terms of improving symptoms.  To help them reached the target tissue, the patient should use Afrin two puffs every 12 hours applied one min before using the nasal steroids.  Afrin should be stopped after no more than 5 days.  If the symptoms worsen, Afrin can be restarted after 5 days off of therapy to prevent rebound congestion from overuse of Afrin.  I also emphasized that in no way are nasal steroids a concern in terms of addiction.    Please remember to go to the lab department   for your tests - we will call you with the results when they are available.      PFTs will be ordered today and we will call you with results

## 2024-05-15 NOTE — Assessment & Plan Note (Addendum)
 Quit smoking 1998  s apparent sequelae  - cardiac Ct  01/25/24 Emphysema.  - Alpha one AT phenotype 05/15/2024 >>>   No clinical evidence of significant copd (Not limited by breathing from desired activities) and no tendency to aecopd/ bronchitis flares despite active rhinitis (see below)  >>>  No f/u needed other than PFTs for baseline purposes.

## 2024-05-17 ENCOUNTER — Ambulatory Visit (INDEPENDENT_AMBULATORY_CARE_PROVIDER_SITE_OTHER)

## 2024-05-17 DIAGNOSIS — J432 Centrilobular emphysema: Secondary | ICD-10-CM | POA: Diagnosis not present

## 2024-05-17 LAB — PULMONARY FUNCTION TEST
DL/VA % pred: 129 %
DL/VA: 5.26 ml/min/mmHg/L
DLCO unc % pred: 106 %
DLCO unc: 32.03 ml/min/mmHg
FEF 25-75 Post: 3.08 L/s
FEF 25-75 Pre: 2.35 L/s
FEF2575-%Change-Post: 30 %
FEF2575-%Pred-Post: 99 %
FEF2575-%Pred-Pre: 75 %
FEV1-%Change-Post: 7 %
FEV1-%Pred-Post: 83 %
FEV1-%Pred-Pre: 77 %
FEV1-Post: 3.31 L
FEV1-Pre: 3.07 L
FEV1FVC-%Change-Post: 7 %
FEV1FVC-%Pred-Pre: 99 %
FEV6-%Change-Post: 0 %
FEV6-%Pred-Post: 81 %
FEV6-%Pred-Pre: 81 %
FEV6-Post: 4.1 L
FEV6-Pre: 4.1 L
FEV6FVC-%Change-Post: 0 %
FEV6FVC-%Pred-Post: 104 %
FEV6FVC-%Pred-Pre: 104 %
FVC-%Change-Post: 0 %
FVC-%Pred-Post: 78 %
FVC-%Pred-Pre: 77 %
FVC-Post: 4.16 L
FVC-Pre: 4.13 L
Post FEV1/FVC ratio: 80 %
Post FEV6/FVC ratio: 100 %
Pre FEV1/FVC ratio: 74 %
Pre FEV6/FVC Ratio: 99 %
RV % pred: 139 %
RV: 3.59 L
TLC % pred: 98 %
TLC: 7.74 L

## 2024-05-17 NOTE — Patient Instructions (Signed)
 Full PFT performed today.

## 2024-05-17 NOTE — Progress Notes (Signed)
 Full PFT performed today.

## 2024-05-18 ENCOUNTER — Ambulatory Visit: Payer: Self-pay | Admitting: Internal Medicine

## 2024-05-18 LAB — CBC WITH DIFFERENTIAL/PLATELET
Basophils Absolute: 0.1 x10E3/uL (ref 0.0–0.2)
Basos: 1 %
EOS (ABSOLUTE): 0.4 x10E3/uL (ref 0.0–0.4)
Eos: 6 %
Hematocrit: 47.3 % (ref 37.5–51.0)
Hemoglobin: 15.4 g/dL (ref 13.0–17.7)
Immature Grans (Abs): 0 x10E3/uL (ref 0.0–0.1)
Immature Granulocytes: 0 %
Lymphocytes Absolute: 2.2 x10E3/uL (ref 0.7–3.1)
Lymphs: 30 %
MCH: 28.4 pg (ref 26.6–33.0)
MCHC: 32.6 g/dL (ref 31.5–35.7)
MCV: 87 fL (ref 79–97)
Monocytes Absolute: 0.5 x10E3/uL (ref 0.1–0.9)
Monocytes: 8 %
Neutrophils Absolute: 3.9 x10E3/uL (ref 1.4–7.0)
Neutrophils: 55 %
Platelets: 189 x10E3/uL (ref 150–450)
RBC: 5.42 x10E6/uL (ref 4.14–5.80)
RDW: 12.5 % (ref 11.6–15.4)
WBC: 7.1 x10E3/uL (ref 3.4–10.8)

## 2024-05-18 LAB — ALPHA-1-ANTITRYPSIN PHENOTYP: A-1 Antitrypsin: 116 mg/dL (ref 101–187)

## 2024-05-18 LAB — IGE: IgE (Immunoglobulin E), Serum: 15 [IU]/mL (ref 6–495)

## 2024-05-21 NOTE — Progress Notes (Signed)
 I called and spoke to pt. Pt informed and verbalized understanding. NFN. Unable to forward a copy to his pcp. I will send a copy via mychart and pt will show his pcp. NFN

## 2024-06-15 ENCOUNTER — Ambulatory Visit
Admission: RE | Admit: 2024-06-15 | Discharge: 2024-06-15 | Disposition: A | Discharge status: Home / Self Care | Attending: Family Medicine

## 2024-06-15 VITALS — BP 127/76 | HR 72 | Temp 98.0°F | Resp 18

## 2024-06-15 DIAGNOSIS — J0100 Acute maxillary sinusitis, unspecified: Secondary | ICD-10-CM

## 2024-06-15 DIAGNOSIS — J3089 Other allergic rhinitis: Secondary | ICD-10-CM

## 2024-06-15 MED ORDER — AZELASTINE HCL 0.1 % NA SOLN: 1.0000 | Freq: Two times a day (BID) | NASAL | 0 refills | Status: AC

## 2024-06-15 MED ORDER — AMOXICILLIN-POT CLAVULANATE 875-125 MG PO TABS: 1.0000 | ORAL_TABLET | Freq: Two times a day (BID) | ORAL | 0 refills | Status: AC

## 2024-06-15 NOTE — ED Triage Notes (Signed)
 Sinus pressure and congestion, pressure in right ear.  Symptoms since Thanksgiving.  Has been using mucinex and flonase .

## 2024-06-15 NOTE — ED Provider Notes (Signed)
 [facility]    CSN: [id number] Arrival date & time: 06/15/24  1348      History   Chief Complaint Chief Complaint  Patient presents with   Nasal Congestion    Also right ear feels stopped up - Entered by patient    HPI [name] is a 63 y.o. male.   Patient presenting today with several week history of progressively worsening nasal congestion, sinus pain and pressure, ear pressure worse on the right, mild cough.  Denies fever, chills, chest pain, shortness of breath, abdominal pain, vomiting, diarrhea.  So far trying Mucinex and Flonase  with minimal relief.  History of seasonal allergies on Singulair and as needed Flonase .    Past Medical History:  Diagnosis Date   BPH (benign prostatic hyperplasia)    Coronary artery disease  08/01/2013   BMS to LAD in 1998 // LHC in 2010 with patent stent // ETT 08/2017 Low Risk   Hyperlipidemia    intol of statins // Evolocumab  Rx    Hypertension     Patient Active Problem List   Diagnosis Date Noted   Centrilobular emphysema (HCC) 05/15/2024   Rhinitis, chronic 05/15/2024   IBS (irritable bowel syndrome) 04/30/2021   Hyperlipidemia 12/31/2016   Coronary artery disease  08/01/2013   GERD (gastroesophageal reflux disease) 03/27/2013   Abdominal pain, left upper quadrant 03/27/2013   Chest pain 09/08/2012   HTN (hypertension) 09/08/2012    Past Surgical History:  Procedure Laterality Date   APPENDECTOMY     CHOLECYSTECTOMY     CORONARY ANGIOPLASTY WITH STENT PLACEMENT     November 1998   ESOPHAGOGASTRODUODENOSCOPY N/A 04/12/2013   Procedure: ESOPHAGOGASTRODUODENOSCOPY (EGD);  Surgeon: Claudis RAYMOND Rivet, MD;  Location: [facility];  Service: Endoscopy;  Laterality: N/A;  325-moved to 130 Melanie notified pt   VASECTOMY         Home Medications    Prior to Admission medications  Medication Sig Start Date End Date Taking? Authorizing Provider  amoxicillin-clavulanate (AUGMENTIN) 875-125 MG tablet Take 1  tablet by mouth every 12 (twelve) hours. 06/15/24  Yes Stuart Vernell Norris, PA-C  azelastine (ASTELIN) 0.1 % nasal spray Place 1 spray into both nostrils 2 (two) times daily. Use in each nostril as directed 06/15/24  Yes Stuart Vernell Norris, PA-C  aspirin  81 MG tablet Take 81 mg by mouth daily.    [provider]  Aspirin -Acetaminophen -Caffeine (EXCEDRIN EXTRA STRENGTH PO) Take 1 tablet by mouth 2 (two) times daily as needed (for pain).    [provider]  augmented betamethasone dipropionate (DIPROLENE-AF) 0.05 % cream Apply 1 application topically 2 (two) times daily.     [provider]  Evolocumab  (REPATHA  SURECLICK) 140 MG/ML SOAJ INJECT CONTENTS OF 1 PEN (140MG ) INTO THE SKIN EVERY 2 WEEKS. 12/21/23   Cooper, Michael, MD  montelukast (SINGULAIR) 10 MG tablet Take 10 mg by mouth at bedtime.    [provider]  naproxen sodium (ANAPROX) 220 MG tablet Take 220 mg by mouth daily as needed (pain).     [provider]  olmesartan (BENICAR) 20 MG tablet Take 20 mg by mouth every evening.    [provider]  tadalafil (CIALIS) 5 MG tablet Take 5 mg by mouth daily. 07/20/22   [provider]  vitamin C (ASCORBIC ACID) 500 MG tablet Take 500 mg by mouth daily.    [provider]    Family History Family History  Problem Relation Age of Onset   Hypertension  Mother    Epilepsy Mother    Aneurysm Mother    Heart disease Father    Cancer Maternal Grandmother    Cancer Maternal Grandfather     Social History Social History[1]   Allergies   Levofloxacin and Statins   Review of Systems Review of Systems PER HPI  Physical Exam Triage Vital Signs ED Triage Vitals  Encounter Vitals Group     BP 06/15/24 1409 127/76     Girls Systolic BP Percentile --      Girls Diastolic BP Percentile --      Boys Systolic BP Percentile --      Boys Diastolic BP Percentile --      Pulse Rate 06/15/24 1409 72     Resp 06/15/24  1409 18     Temp 06/15/24 1409 98 F (36.7 C)     Temp Source 06/15/24 1409 Oral     SpO2 06/15/24 1409 94 %     Weight --      Height --      Head Circumference --      Peak Flow --      Pain Score 06/15/24 1411 2     Pain Loc --      Pain Education --      Exclude from Growth Chart --    No data found.  Updated Vital Signs BP 127/76 (BP Location: Right Arm)   Pulse 72   Temp 98 F (36.7 C) (Oral)   Resp 18   SpO2 94%   Visual Acuity Right Eye Distance:   Left Eye Distance:   Bilateral Distance:    Right Eye Near:   Left Eye Near:    Bilateral Near:     Physical Exam Vitals and nursing note reviewed.  Constitutional:      Appearance: He is well-developed.  HENT:     Head: Atraumatic.     Right Ear: External ear normal.     Left Ear: External ear normal.     Ears:     Comments: Bilateral middle ear effusions    Nose: Congestion present.     Mouth/Throat:     Pharynx: Posterior oropharyngeal erythema present. No oropharyngeal exudate.  Eyes:     Conjunctiva/sclera: Conjunctivae normal.     Pupils: Pupils are equal, round, and reactive to light.  Cardiovascular:     Rate and Rhythm: Normal rate and regular rhythm.  Pulmonary:     Effort: Pulmonary effort is normal. No respiratory distress.     Breath sounds: No wheezing or rales.  Musculoskeletal:        General: Normal range of motion.     Cervical back: Normal range of motion and neck supple.  Lymphadenopathy:     Cervical: No cervical adenopathy.  Skin:    General: Skin is warm and dry.  Neurological:     Mental Status: He is alert and oriented to person, place, and time.  Psychiatric:        Behavior: Behavior normal.      UC Treatments / Results  Labs (all labs ordered are listed, but only abnormal results are displayed) Labs Reviewed - No data to display  EKG   Radiology No results found.  Procedures Procedures (including critical care time)  Medications Ordered in UC Medications  - No data to display  Initial Impression / Assessment and Plan / UC Course  I have reviewed the triage vital signs and the nursing notes.  Pertinent labs &  imaging results that were available during my care of the patient were reviewed by me and considered in my medical decision making (see chart for details).     Exam reassuring today, given duration worsening course will treat for sinusitis with Augmentin, Astelin, and discussed adding antihistamine to Flonase  and Singulair regimen for better coverage.  Return for worsening or unresolving symptoms.  Final Clinical Impressions(s) / UC Diagnoses   Final diagnoses:  Acute non-recurrent maxillary sinusitis  Seasonal allergic rhinitis due to other allergic trigger   Discharge Instructions   None    ED Prescriptions     Medication Sig Dispense Auth. Provider   amoxicillin-clavulanate (AUGMENTIN) 875-125 MG tablet Take 1 tablet by mouth every 12 (twelve) hours. 14 tablet Stuart Vernell Norris, PA-C   azelastine (ASTELIN) 0.1 % nasal spray Place 1 spray into both nostrils 2 (two) times daily. Use in each nostril as directed 30 mL Stuart Vernell Norris, PA-C      PDMP not reviewed this encounter.    [1]  Social History Tobacco Use   Smoking status: Former    Current packs/day: 0.00    Types: Cigarettes    Quit date: 05/15/1996    Years since quitting: 28.1   Smokeless tobacco: Never   Tobacco comments:    quit 08/1996  Vaping Use   Vaping status: Never Used  Substance Use Topics   Alcohol use: Yes    Alcohol/week: 2.0 standard drinks of alcohol    Types: 2 Cans of beer per week    Comment: rare twice a week   Drug use: No     Stuart Vernell Norris, PA-C 06/15/24 1443
